# Patient Record
Sex: Female | Born: 1960 | Race: White | Hispanic: No | Marital: Married | Smoking: Never smoker
Health system: Southern US, Community
[De-identification: ages and names within clinical notes are randomized; demographics above are authoritative.]

## PROBLEM LIST (undated history)

## (undated) DIAGNOSIS — I10 Essential (primary) hypertension: Secondary | ICD-10-CM

## (undated) DIAGNOSIS — I700 Atherosclerosis of aorta: Secondary | ICD-10-CM

## (undated) DIAGNOSIS — E7800 Pure hypercholesterolemia, unspecified: Secondary | ICD-10-CM

## (undated) DIAGNOSIS — R931 Abnormal findings on diagnostic imaging of heart and coronary circulation: Secondary | ICD-10-CM

## (undated) DIAGNOSIS — E785 Hyperlipidemia, unspecified: Secondary | ICD-10-CM

## (undated) DIAGNOSIS — J302 Other seasonal allergic rhinitis: Secondary | ICD-10-CM

## (undated) DIAGNOSIS — R921 Mammographic calcification found on diagnostic imaging of breast: Secondary | ICD-10-CM

## (undated) DIAGNOSIS — Z98811 Dental restoration status: Secondary | ICD-10-CM

## (undated) DIAGNOSIS — R51 Headache: Secondary | ICD-10-CM

## (undated) DIAGNOSIS — IMO0002 Reserved for concepts with insufficient information to code with codable children: Secondary | ICD-10-CM

## (undated) DIAGNOSIS — Z972 Presence of dental prosthetic device (complete) (partial): Secondary | ICD-10-CM

## (undated) HISTORY — DX: Reserved for concepts with insufficient information to code with codable children: IMO0002

## (undated) HISTORY — DX: Atherosclerosis of aorta: I70.0

## (undated) HISTORY — DX: Pure hypercholesterolemia, unspecified: E78.00

## (undated) HISTORY — DX: Essential (primary) hypertension: I10

## (undated) HISTORY — DX: Abnormal findings on diagnostic imaging of heart and coronary circulation: R93.1

## (undated) HISTORY — DX: Hyperlipidemia, unspecified: E78.5

---

## 1997-08-17 ENCOUNTER — Other Ambulatory Visit: Admission: RE | Admit: 1997-08-17 | Discharge: 1997-08-17 | Payer: Self-pay | Admitting: Obstetrics and Gynecology

## 1999-08-07 ENCOUNTER — Other Ambulatory Visit: Admission: RE | Admit: 1999-08-07 | Discharge: 1999-08-07 | Payer: Self-pay | Admitting: Obstetrics and Gynecology

## 2000-03-06 ENCOUNTER — Other Ambulatory Visit: Admission: RE | Admit: 2000-03-06 | Discharge: 2000-03-06 | Payer: Self-pay | Admitting: Obstetrics and Gynecology

## 2000-03-06 ENCOUNTER — Encounter (INDEPENDENT_AMBULATORY_CARE_PROVIDER_SITE_OTHER): Payer: Self-pay | Admitting: Specialist

## 2000-05-04 ENCOUNTER — Encounter: Payer: Self-pay | Admitting: Cardiology

## 2000-05-04 ENCOUNTER — Inpatient Hospital Stay (HOSPITAL_COMMUNITY): Admission: EM | Admit: 2000-05-04 | Discharge: 2000-05-05 | Payer: Self-pay | Admitting: Emergency Medicine

## 2000-08-03 ENCOUNTER — Other Ambulatory Visit: Admission: RE | Admit: 2000-08-03 | Discharge: 2000-08-03 | Payer: Self-pay | Admitting: Obstetrics and Gynecology

## 2000-11-03 ENCOUNTER — Ambulatory Visit (HOSPITAL_COMMUNITY): Admission: RE | Admit: 2000-11-03 | Discharge: 2000-11-03 | Payer: Self-pay | Admitting: Obstetrics and Gynecology

## 2000-11-03 ENCOUNTER — Encounter: Payer: Self-pay | Admitting: Obstetrics and Gynecology

## 2001-01-15 ENCOUNTER — Encounter: Payer: Self-pay | Admitting: Family Medicine

## 2001-01-15 ENCOUNTER — Ambulatory Visit (HOSPITAL_COMMUNITY): Admission: RE | Admit: 2001-01-15 | Discharge: 2001-01-15 | Payer: Self-pay | Admitting: Family Medicine

## 2001-10-15 ENCOUNTER — Other Ambulatory Visit: Admission: RE | Admit: 2001-10-15 | Discharge: 2001-10-15 | Payer: Self-pay | Admitting: Obstetrics and Gynecology

## 2001-11-05 ENCOUNTER — Encounter: Admission: RE | Admit: 2001-11-05 | Discharge: 2001-11-05 | Payer: Self-pay | Admitting: Obstetrics and Gynecology

## 2001-11-05 ENCOUNTER — Encounter: Payer: Self-pay | Admitting: Obstetrics and Gynecology

## 2002-10-24 ENCOUNTER — Other Ambulatory Visit: Admission: RE | Admit: 2002-10-24 | Discharge: 2002-10-24 | Payer: Self-pay | Admitting: Obstetrics and Gynecology

## 2002-11-25 ENCOUNTER — Ambulatory Visit (HOSPITAL_COMMUNITY): Admission: RE | Admit: 2002-11-25 | Discharge: 2002-11-25 | Payer: Self-pay | Admitting: Obstetrics and Gynecology

## 2003-11-27 ENCOUNTER — Ambulatory Visit (HOSPITAL_COMMUNITY): Admission: RE | Admit: 2003-11-27 | Discharge: 2003-11-27 | Payer: Self-pay | Admitting: Obstetrics and Gynecology

## 2003-12-12 ENCOUNTER — Other Ambulatory Visit: Admission: RE | Admit: 2003-12-12 | Discharge: 2003-12-12 | Payer: Self-pay | Admitting: Obstetrics and Gynecology

## 2004-11-27 ENCOUNTER — Ambulatory Visit (HOSPITAL_COMMUNITY): Admission: RE | Admit: 2004-11-27 | Discharge: 2004-11-27 | Payer: Self-pay | Admitting: Obstetrics and Gynecology

## 2004-12-12 ENCOUNTER — Encounter: Admission: RE | Admit: 2004-12-12 | Discharge: 2004-12-12 | Payer: Self-pay | Admitting: Obstetrics and Gynecology

## 2005-09-10 ENCOUNTER — Other Ambulatory Visit: Admission: RE | Admit: 2005-09-10 | Discharge: 2005-09-10 | Payer: Self-pay | Admitting: Obstetrics & Gynecology

## 2005-12-01 ENCOUNTER — Ambulatory Visit (HOSPITAL_COMMUNITY): Admission: RE | Admit: 2005-12-01 | Discharge: 2005-12-01 | Payer: Self-pay | Admitting: Obstetrics & Gynecology

## 2005-12-10 ENCOUNTER — Encounter: Admission: RE | Admit: 2005-12-10 | Discharge: 2005-12-10 | Payer: Self-pay | Admitting: Obstetrics & Gynecology

## 2006-10-06 ENCOUNTER — Other Ambulatory Visit: Admission: RE | Admit: 2006-10-06 | Discharge: 2006-10-06 | Payer: Self-pay | Admitting: Obstetrics & Gynecology

## 2006-11-09 ENCOUNTER — Ambulatory Visit (HOSPITAL_BASED_OUTPATIENT_CLINIC_OR_DEPARTMENT_OTHER): Admission: RE | Admit: 2006-11-09 | Discharge: 2006-11-09 | Payer: Self-pay | Admitting: Obstetrics & Gynecology

## 2006-11-09 HISTORY — PX: HYSTEROSCOPY W/ ENDOMETRIAL ABLATION: SUR665

## 2006-12-03 ENCOUNTER — Ambulatory Visit (HOSPITAL_COMMUNITY): Admission: RE | Admit: 2006-12-03 | Discharge: 2006-12-03 | Payer: Self-pay | Admitting: Obstetrics & Gynecology

## 2007-01-01 ENCOUNTER — Encounter: Admission: RE | Admit: 2007-01-01 | Discharge: 2007-01-01 | Payer: Self-pay | Admitting: Obstetrics & Gynecology

## 2007-09-29 ENCOUNTER — Encounter: Admission: RE | Admit: 2007-09-29 | Discharge: 2007-09-29 | Payer: Self-pay | Admitting: Family

## 2007-12-07 ENCOUNTER — Ambulatory Visit (HOSPITAL_COMMUNITY): Admission: RE | Admit: 2007-12-07 | Discharge: 2007-12-08 | Payer: Self-pay | Admitting: Obstetrics & Gynecology

## 2007-12-30 ENCOUNTER — Other Ambulatory Visit: Admission: RE | Admit: 2007-12-30 | Discharge: 2007-12-30 | Payer: Self-pay | Admitting: Obstetrics & Gynecology

## 2009-03-28 ENCOUNTER — Ambulatory Visit (HOSPITAL_COMMUNITY)
Admission: RE | Admit: 2009-03-28 | Discharge: 2009-03-28 | Payer: Self-pay | Source: Home / Self Care | Admitting: Obstetrics & Gynecology

## 2010-02-17 ENCOUNTER — Encounter: Payer: Self-pay | Admitting: Obstetrics & Gynecology

## 2010-03-28 ENCOUNTER — Other Ambulatory Visit: Payer: Self-pay | Admitting: Obstetrics & Gynecology

## 2010-03-28 DIAGNOSIS — Z1231 Encounter for screening mammogram for malignant neoplasm of breast: Secondary | ICD-10-CM

## 2010-04-16 ENCOUNTER — Ambulatory Visit (HOSPITAL_COMMUNITY)
Admission: RE | Admit: 2010-04-16 | Discharge: 2010-04-16 | Disposition: A | Discharge status: Home / Self Care | Payer: BC Managed Care – PPO | Source: Ambulatory Visit | Attending: Obstetrics & Gynecology | Admitting: Obstetrics & Gynecology

## 2010-04-16 DIAGNOSIS — Z1231 Encounter for screening mammogram for malignant neoplasm of breast: Secondary | ICD-10-CM | POA: Insufficient documentation

## 2010-06-11 NOTE — Op Note (Signed)
NAMESHANIKA, LEVINGS            ACCOUNT NO.:  [id number]   MEDICAL RECORD NO.:  [id number]          PATIENT TYPE:  AMB   LOCATION:  [facility]                         FACILITY:  [facility]   PHYSICIAN:  M. Leda Quail, MD  DATE OF BIRTH:  07-03-1960   DATE OF PROCEDURE:  11/09/2006  DATE OF DISCHARGE:                               OPERATIVE REPORT   PREOPERATIVE DIAGNOSES:  39. A 50 year old G2, P61, married white female with menorrhagia.  2. Elevated lipids.   POSTOPERATIVE DIAGNOSES:  103. A 50 year old G2, P94, married white female with menorrhagia.  2. Elevated lipids.   PROCEDURE:  HTA with hysteroscopy and paracervical block.   SURGEON:  Dr. Hyacinth Meeker   ASSISTANT:  OR staff.  Dr. Council Mechanic was the anesthesiologist overseeing  the case.   ANESTHESIA:  MAC.   FINDINGS:  Fluff endometrial tissue but otherwise normal cavity without  any polyps or fibroids noted.  Normal tubal ostia are noted bilaterally.   SPECIMENS:  None.   ESTIMATED BLOOD LOSS:  Minimal.   FLUIDS:  1000 mL of LR.   URINE OUTPUT:  She had DDCs drained with an I&O catheterization at the  beginning of the procedure.   FLUID:  Deficit with 3% sorbitol is 0.   COMPLICATIONS:  None.   INDICATIONS:  Ms. Carnetta Losada is a very nice 50 year old G2, P83,  married white female who has a history of menorrhagia.  She has flow  that lasts about 1 week with clots that last for 3-4 days.  These are  particularly bad days with clots of dysmenorrhea associated.  This has  worsened over the last year.  She did undergo a workup including an  ultrasound with a sonohysterogram.  She had a 9.5 x 4.5 x 5 cm uterus  with a 7.9 mm endometrium.  She had otherwise normal-appearing ovaries.  She had an endometrial biopsy that showed benign proliferative  endometrium.  We discussed treatment options including oral  contraceptives, a Marina IUD, an endometrial ablation, and hysterectomy,  and she has opted to proceed with an ablation.   The patient is here for  this procedure today.   PROCEDURE:  The patient is taken to the operating room.  She is placed  in supine position.  MAC anesthesia is administered by the anesthesia  staff without difficulty.  The legs are then positioned in the low  lithotomy position in [place] stirrups.  Then the legs are positioned in  high lithotomy position, and she is prepped in a normal sterile fashion.  No Betadine solution was used, as THE PATIENT IS ALLERGIC TO IODINE.  An  I&O catheterization was performed without difficulty.  The patient is  draped in normal sterile fashion.   A bivalve speculum was placed in the vagina.  The anterior lip of the  cervix is grasped with single-tooth tenaculum.  The uterus is sounded to  10 cm.  The cervical canal is approximately 4.5 cm in length with the  endometrial cavity measuring about 5.5 cm.  Using [employer] dilators, a  Hagar dilator is inserted and dilated up to a #7.  A diagnostic  hysteroscope is obtained.  Then 3% sorbitol is used as a hysteroscopic  media.  This hysteroscope is passed through the endocervical canal with  good distention of the cavity.  Tubal ostia are noted bilaterally, and  photodocumentation is made.  The cavity otherwise appears normal except  for some fluffy endometrial tissue.  The scope is removed.  The NovaSure  apparatus is obtained.  It is raised opened outside of the patient and  showed that it is working properly.  This is passed to the fundus then  raised and opened.  The device is then seated by moving it up and down,  back and forth, and twisting it 90 degrees in each direction.  The array  did pop open, as indicated with the dial on the ray apparatus.  This was  approximately 3.5 cm.  The seating was performed again by moving the  device up and down, to the right and left, and twisting the device to  the right and left without any change in the width of the array.  Then  at this point, a cavity assessment test  was performed.  This was passed  easily.  Of note, the vacuum valve on the device had already been tapped  before placing this through the cervix.  At this point, the ablation  cycle was performed.  This lasted for 70 seconds with a power of 106.  After this procedure was performed, the device was unlocked, and the  array was closed, and the entire device was brought out of the uterus.  Then using a hysteroscope, the cavity was revisualized.  An excellent  ablation was noted.  This was removed.  Paracervical block of 1%  lidocaine was instilled in the cervix with 2.5 mL instilled at 3, 6, 9,  and 12 o'clock positions.  This was a total of 10 mL of lidocaine.  Then  30 mg of Toradol IV was given at this point.  The patient tolerated the  procedure very well.  She was awakened from her anesthesia without  difficulty after her legs were removed from the [place] stirrups and  placed back in the supine position.  Sponge, lap, needle, and instrument  counts were correct x2.  The patient was awake and taken to the recovery  room in stable condition.      Lum Keas, MD  Electronically Signed     MSM/MEDQ  D:  11/09/2006  T:  11/09/2006  Job:  [id number]

## 2010-06-14 NOTE — Discharge Summary (Signed)
[place]. [facility]  Patient:    Margaret Webb, Margaret Webb                      MRN: [id number] Adm. Date:  [id number] Disc. Date: [id number] Attending:  Talitha Givens Dictator:   Abelino Derrick, P.A.C. [facility] CC:         Deanna Artis. Sharene Skeans, M.D.                  Referring Physician Discharge [place]  DISCHARGE DIAGNOSES: 1. Chest pain, negative Cardiolite, this admission. 2. Treated hyperlipidemia. 3. Carpal tunnel syndrome.  HOSPITAL COURSE:  The patient is a 50 year old female with no cardiac history, who presented May 04, 2000 with chest pain.  She was seen by Dr. Luis Abed on admission.  She has a history of treated hyperlipidemia and is on Zocor 40 mg a day.  Patient was admitted to telemetry for further evaluation.  Her EKG showed sinus arrhythmia.  Enzymes were negative.  She was seen in consult by the neurology service; Dr. Deanna Artis. Hickling saw her for paresthesia with glove distribution.  Patient was set up for a Cardiolite study, which was done May 05, 2000, and revealed normal LV function and no ischemia.  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 Dr. Genene Churn. Love suspected carpal tunnel syndrome.  Plans were made to arrange for wrist splints and Celebrex.  She will continue her home Zocor. The patient did not want to wait for the final Cardiolite results and pretty much insisted on discharge later on the 9th.  LABORATORY DATA:  EKG showed a sinus rhythm without acute changes.  C spine films show degenerative hypertrophic spurs, no evidence of metastatic disease or fracture.  CKs and troponins are negative.  INR is 1.1.  WBC 8.9, hemoglobin 12.9, hematocrit 38.5 and platelets 309,000.  Sodium 141, potassium 3.9, BUN 11, creatinine 0.6.  B12 is 405.  Folate, RPR and protein are pending; these will need to be followed up as an outpatient.  Lipid profile shows an LDL of 136, HDL 46.  Sed rate 13.  TSH 5.05.  RPR is  nonreactive on final report.  B12 level 405.  RBC folate will be followed up.  DISPOSITION:  Patient is discharged in stable condition and will follow up with Dr. Hosie Spangle. [facility], [facility]. DD:  05/06/00 TD:  05/06/00 Job: [id number]
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
## 2010-11-07 LAB — POCT PREGNANCY, URINE
Operator id: 114531
Preg Test, Ur: NEGATIVE

## 2010-11-07 LAB — CBC
HCT: 36.6
Hemoglobin: 12.5
MCHC: 34.1
MCV: 88.4
Platelets: 287
RBC: 4.14
RDW: 13
WBC: 8.2

## 2010-11-07 LAB — BASIC METABOLIC PANEL
BUN: 12
CO2: 25
Calcium: 9.6
Chloride: 108
Creatinine, Ser: 0.8
GFR calc Af Amer: >60
GFR calc non Af Amer: >60
Glucose, Bld: 97
Potassium: 4.1
Sodium: 140

## 2010-11-07 LAB — URINALYSIS, ROUTINE W REFLEX MICROSCOPIC
Bilirubin Urine: NEGATIVE
Glucose, UA: NEGATIVE
Ketones, ur: NEGATIVE
Leukocytes, UA: NEGATIVE
Nitrite: NEGATIVE
Protein, ur: NEGATIVE
Specific Gravity, Urine: 1.017
Urobilinogen, UA: 0.2
pH: 5.5

## 2010-11-07 LAB — URINE MICROSCOPIC-ADD ON

## 2011-05-12 ENCOUNTER — Other Ambulatory Visit: Payer: Self-pay | Admitting: Obstetrics & Gynecology

## 2011-05-12 DIAGNOSIS — Z1231 Encounter for screening mammogram for malignant neoplasm of breast: Secondary | ICD-10-CM

## 2011-06-12 ENCOUNTER — Ambulatory Visit (HOSPITAL_COMMUNITY)
Admission: RE | Admit: 2011-06-12 | Discharge: 2011-06-12 | Disposition: A | Discharge status: Home / Self Care | Payer: BC Managed Care – PPO | Source: Ambulatory Visit | Attending: Obstetrics & Gynecology | Admitting: Obstetrics & Gynecology

## 2011-06-12 DIAGNOSIS — Z1231 Encounter for screening mammogram for malignant neoplasm of breast: Secondary | ICD-10-CM | POA: Insufficient documentation

## 2012-05-21 ENCOUNTER — Other Ambulatory Visit: Payer: Self-pay

## 2012-05-21 DIAGNOSIS — Z1231 Encounter for screening mammogram for malignant neoplasm of breast: Secondary | ICD-10-CM

## 2012-07-06 ENCOUNTER — Ambulatory Visit
Admission: RE | Admit: 2012-07-06 | Discharge: 2012-07-06 | Disposition: A | Discharge status: Home / Self Care | Payer: BC Managed Care – PPO | Source: Ambulatory Visit

## 2012-07-06 DIAGNOSIS — Z1231 Encounter for screening mammogram for malignant neoplasm of breast: Secondary | ICD-10-CM

## 2012-07-07 ENCOUNTER — Ambulatory Visit (HOSPITAL_COMMUNITY)
Admission: RE | Admit: 2012-07-07 | Discharge: 2012-07-07 | Disposition: A | Discharge status: Home / Self Care | Payer: BC Managed Care – PPO | Source: Ambulatory Visit | Attending: Family Medicine | Admitting: Family Medicine

## 2012-07-07 ENCOUNTER — Other Ambulatory Visit (HOSPITAL_COMMUNITY): Payer: Self-pay | Admitting: Family Medicine

## 2012-07-07 ENCOUNTER — Other Ambulatory Visit: Payer: Self-pay | Admitting: Obstetrics & Gynecology

## 2012-07-07 DIAGNOSIS — M79609 Pain in unspecified limb: Secondary | ICD-10-CM

## 2012-07-07 DIAGNOSIS — M25561 Pain in right knee: Secondary | ICD-10-CM

## 2012-07-07 DIAGNOSIS — R928 Other abnormal and inconclusive findings on diagnostic imaging of breast: Secondary | ICD-10-CM

## 2012-07-07 DIAGNOSIS — M25569 Pain in unspecified knee: Secondary | ICD-10-CM | POA: Insufficient documentation

## 2012-07-07 NOTE — Progress Notes (Signed)
Right lower extremity venous duplex completed.  Right:  No evidence of DVT, superficial thrombosis, or Baker's cyst.  Left:  Negative for DVT in the common femoral vein.  

## 2012-07-12 ENCOUNTER — Ambulatory Visit
Admission: RE | Admit: 2012-07-12 | Discharge: 2012-07-12 | Disposition: A | Discharge status: Home / Self Care | Payer: BC Managed Care – PPO | Source: Ambulatory Visit | Attending: Obstetrics & Gynecology | Admitting: Obstetrics & Gynecology

## 2012-07-12 ENCOUNTER — Other Ambulatory Visit: Payer: Self-pay | Admitting: Obstetrics & Gynecology

## 2012-07-12 DIAGNOSIS — R921 Mammographic calcification found on diagnostic imaging of breast: Secondary | ICD-10-CM

## 2012-07-12 DIAGNOSIS — R928 Other abnormal and inconclusive findings on diagnostic imaging of breast: Secondary | ICD-10-CM

## 2012-07-19 ENCOUNTER — Ambulatory Visit
Admission: RE | Admit: 2012-07-19 | Discharge: 2012-07-19 | Disposition: A | Discharge status: Home / Self Care | Payer: BC Managed Care – PPO | Source: Ambulatory Visit | Attending: Obstetrics & Gynecology | Admitting: Obstetrics & Gynecology

## 2012-07-19 ENCOUNTER — Other Ambulatory Visit (HOSPITAL_COMMUNITY): Payer: Self-pay | Admitting: Radiology

## 2012-07-19 DIAGNOSIS — R921 Mammographic calcification found on diagnostic imaging of breast: Secondary | ICD-10-CM

## 2012-07-27 DIAGNOSIS — R921 Mammographic calcification found on diagnostic imaging of breast: Secondary | ICD-10-CM

## 2012-07-27 HISTORY — DX: Mammographic calcification found on diagnostic imaging of breast: R92.1

## 2012-08-02 ENCOUNTER — Encounter (INDEPENDENT_AMBULATORY_CARE_PROVIDER_SITE_OTHER): Payer: Self-pay | Admitting: Surgery

## 2012-08-02 ENCOUNTER — Ambulatory Visit (INDEPENDENT_AMBULATORY_CARE_PROVIDER_SITE_OTHER): Payer: BC Managed Care – PPO | Admitting: Surgery

## 2012-08-02 VITALS — BP 136/90 | HR 76 | Temp 97.8°F | Resp 14 | Ht 65.0 in | Wt 192.8 lb

## 2012-08-02 DIAGNOSIS — R928 Other abnormal and inconclusive findings on diagnostic imaging of breast: Secondary | ICD-10-CM

## 2012-08-02 DIAGNOSIS — R921 Mammographic calcification found on diagnostic imaging of breast: Secondary | ICD-10-CM

## 2012-08-02 NOTE — Patient Instructions (Signed)
We will schedule outpatient surgery to remove a small area of calcifications from your right breast

## 2012-08-02 NOTE — Progress Notes (Signed)
NAMENILZA Webb DOB: February 04, 1960 MRN: [id number]                                                                                      DATE: 08/02/2012  PCP: Margaret Boots, MD Referring Provider: No ref. provider found  IMPRESSION:  Complex sclerosing lesion with associated calcifications, right breast  PLAN:   I recommended wire localized excision. I have discussed the indications for the lumpectomy and described the procedure. She understand that the chance of removal of the abnormal area is very good, but that occasionally we are unable to locate it and may have to do a second procedure. We also discussed the possibility of a second procedure to get additional tissue. Risks of surgery such as bleeding and infection have also been explained, as well as the implications of not doing the surgery. She understands and wishes to proceed.                  CC:  Chief Complaint  Patient presents with  . Breast Problem    Right breast    HPI:  Margaret Webb is a 52 y.o.  female who presents for evaluation of A complex sclerosing lesion of the right breast found on recent biopsy. She had a routine mammogram done and some calcifications that were somewhat worrisome or found. A core biopsy was done and fibrocystic changes and a small complex sclerosing lesion with calcifications were noted. 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 She has no breast symptoms and has had no prior breast problems. She has a sister who was diagnosed with premenopausal breast cancer about 10 years ago and now has metastatic disease to bones. The sister was tested for genetic abnormalities about 2 years ago and found to be negative. It is uncertain exactly what tests were done however.  PMH:  has no past medical history on file.  PSH:   has past surgical history that includes Cesarean section.  ALLERGIES:  No Known Allergies  MEDICATIONS: Current outpatient  prescriptions:carisoprodol (SOMA) 350 MG tablet, Take 350 mg by mouth 4 (four) times daily as needed for muscle spasms., Disp: , Rfl: ;  atorvastatin (LIPITOR) 40 MG tablet, , Disp: , Rfl: ;  zolpidem (AMBIEN) 10 MG tablet, , Disp: , Rfl:   ROS: She has filled out our 12 point review of systems and it is negative . EXAM:   VITAL SIGNS:  BP 136/90  Pulse 76  Temp(Src) 97.8 F (36.6 C) (Temporal)  Resp 14  Ht 5\' 5"  (1.651 m)  Wt 192 lb 12.8 oz (87.454 kg)  BMI 32.08 kg/m2  GENERAL:  The patient is alert, oriented, and generally healthy-appearing, NAD. Mood and affect are normal.  HEENT:  The head is normocephalic, the eyes nonicteric, the pupils were round regular and equal. EOMs are normal. Pharynx normal. Dentition good.  NECK:  The neck is supple and there are no masses or thyromegaly.  LUNGS: Normal respirations and clear to auscultation.  HEART: Regular rhythm, with no murmurs rubs or gallops. Pulses are intact carotid dorsalis pedis and posterior tibial. No significant varicosities are noted.  BREASTS:  breasts symmetric in size and shape. There is a mild ecchymosis of the right breast lower outer quadrant. The breasts are little bit dense consistent with some fibrocystic changes. I do not appreciate a dominant mass. The skin nipple and areolar areas are normal.  Lymphatics: There is no action or supraclavicular adenopathy.  ABDOMEN: Soft, flat, and nontender. No masses or organomegaly is noted. No hernias are noted. Bowel sounds are normal.  EXTREMITIES:  Good range of motion, no edema.   DATA REVIEWED:  I have reviewed the mammograms ultrasound and pathology reports    Margaret Webb J 08/02/2012  CC: No ref. provider found, Margaret Webb, Margaret Stalker, MD

## 2012-08-03 ENCOUNTER — Encounter (HOSPITAL_BASED_OUTPATIENT_CLINIC_OR_DEPARTMENT_OTHER): Payer: Self-pay | Admitting: *Deleted

## 2012-08-09 ENCOUNTER — Ambulatory Visit (HOSPITAL_BASED_OUTPATIENT_CLINIC_OR_DEPARTMENT_OTHER): Payer: BC Managed Care – PPO | Admitting: Certified Registered Nurse Anesthetist

## 2012-08-09 ENCOUNTER — Encounter (HOSPITAL_BASED_OUTPATIENT_CLINIC_OR_DEPARTMENT_OTHER): Payer: Self-pay | Admitting: Certified Registered Nurse Anesthetist

## 2012-08-09 ENCOUNTER — Encounter (HOSPITAL_BASED_OUTPATIENT_CLINIC_OR_DEPARTMENT_OTHER)
Admission: RE | Disposition: A | Discharge status: Home / Self Care | Payer: Self-pay | Source: Ambulatory Visit | Attending: Surgery

## 2012-08-09 ENCOUNTER — Ambulatory Visit
Admission: RE | Admit: 2012-08-09 | Discharge: 2012-08-09 | Disposition: A | Discharge status: Home / Self Care | Payer: BC Managed Care – PPO | Source: Ambulatory Visit | Attending: Surgery | Admitting: Surgery

## 2012-08-09 ENCOUNTER — Ambulatory Visit (HOSPITAL_BASED_OUTPATIENT_CLINIC_OR_DEPARTMENT_OTHER)
Admission: RE | Admit: 2012-08-09 | Discharge: 2012-08-09 | Disposition: A | Discharge status: Home / Self Care | Payer: BC Managed Care – PPO | Source: Ambulatory Visit | Attending: Surgery | Admitting: Surgery

## 2012-08-09 DIAGNOSIS — Z803 Family history of malignant neoplasm of breast: Secondary | ICD-10-CM | POA: Insufficient documentation

## 2012-08-09 DIAGNOSIS — R921 Mammographic calcification found on diagnostic imaging of breast: Secondary | ICD-10-CM

## 2012-08-09 DIAGNOSIS — N6029 Fibroadenosis of unspecified breast: Secondary | ICD-10-CM | POA: Insufficient documentation

## 2012-08-09 DIAGNOSIS — N6019 Diffuse cystic mastopathy of unspecified breast: Secondary | ICD-10-CM | POA: Insufficient documentation

## 2012-08-09 DIAGNOSIS — D249 Benign neoplasm of unspecified breast: Secondary | ICD-10-CM | POA: Insufficient documentation

## 2012-08-09 DIAGNOSIS — Z79899 Other long term (current) drug therapy: Secondary | ICD-10-CM | POA: Insufficient documentation

## 2012-08-09 HISTORY — DX: Mammographic calcification found on diagnostic imaging of breast: R92.1

## 2012-08-09 HISTORY — DX: Presence of dental prosthetic device (complete) (partial): Z97.2

## 2012-08-09 HISTORY — DX: Headache: R51

## 2012-08-09 HISTORY — PX: BREAST BIOPSY: SHX20

## 2012-08-09 HISTORY — DX: Dental restoration status: Z98.811

## 2012-08-09 HISTORY — DX: Other seasonal allergic rhinitis: J30.2

## 2012-08-09 SURGERY — BREAST BIOPSY WITH NEEDLE LOCALIZATION
Anesthesia: General | Site: Breast | Laterality: Right | Wound class: Clean

## 2012-08-09 MED ORDER — MIDAZOLAM HCL 5 MG/5ML IJ SOLN
INTRAMUSCULAR | Status: DC | PRN
  Administered 2012-08-09: 2 mg via INTRAVENOUS

## 2012-08-09 MED ORDER — PROMETHAZINE HCL 25 MG/ML IJ SOLN
6.2500 mg | INTRAMUSCULAR | Status: DC | PRN
  Administered 2012-08-09: 6.25 mg via INTRAVENOUS

## 2012-08-09 MED ORDER — FENTANYL CITRATE 0.05 MG/ML IJ SOLN
INTRAMUSCULAR | Status: DC | PRN
  Administered 2012-08-09 (×2): 50 ug via INTRAVENOUS

## 2012-08-09 MED ORDER — FENTANYL CITRATE 0.05 MG/ML IJ SOLN
25.0000 ug | INTRAMUSCULAR | Status: DC | PRN
  Administered 2012-08-09: 50 ug via INTRAVENOUS

## 2012-08-09 MED ORDER — CHLORHEXIDINE GLUCONATE 4 % EX LIQD: 1.0000 "application " | Freq: Once | CUTANEOUS | Status: DC

## 2012-08-09 MED ORDER — ONDANSETRON HCL 4 MG/2ML IJ SOLN
INTRAMUSCULAR | Status: DC | PRN
  Administered 2012-08-09: 4 mg via INTRAVENOUS

## 2012-08-09 MED ORDER — OXYCODONE HCL 5 MG PO TABS: 5.0000 mg | ORAL_TABLET | Freq: Once | ORAL | Status: DC | PRN

## 2012-08-09 MED ORDER — ONDANSETRON HCL 4 MG/2ML IJ SOLN: 4.0000 mg | Freq: Four times a day (QID) | INTRAMUSCULAR | Status: DC | PRN

## 2012-08-09 MED ORDER — FENTANYL CITRATE 0.05 MG/ML IJ SOLN: 50.0000 ug | INTRAMUSCULAR | Status: DC | PRN

## 2012-08-09 MED ORDER — LACTATED RINGERS IV SOLN
INTRAVENOUS | Status: DC
  Administered 2012-08-09 (×2): via INTRAVENOUS

## 2012-08-09 MED ORDER — PROPOFOL 10 MG/ML IV BOLUS
INTRAVENOUS | Status: DC | PRN
  Administered 2012-08-09: 200 mg via INTRAVENOUS

## 2012-08-09 MED ORDER — CEFAZOLIN SODIUM-DEXTROSE 2-3 GM-% IV SOLR
2.0000 g | INTRAVENOUS | Status: AC
  Administered 2012-08-09: 2 g via INTRAVENOUS

## 2012-08-09 MED ORDER — BUPIVACAINE HCL (PF) 0.25 % IJ SOLN
INTRAMUSCULAR | Status: DC | PRN
  Administered 2012-08-09: 20 mL

## 2012-08-09 MED ORDER — HYDROCODONE-ACETAMINOPHEN 5-325 MG PO TABS: 1.0000 | ORAL_TABLET | ORAL | Status: DC | PRN

## 2012-08-09 MED ORDER — OXYCODONE HCL 5 MG/5ML PO SOLN: 5.0000 mg | Freq: Once | ORAL | Status: DC | PRN

## 2012-08-09 MED ORDER — DEXAMETHASONE SODIUM PHOSPHATE 4 MG/ML IJ SOLN
INTRAMUSCULAR | Status: DC | PRN
  Administered 2012-08-09: 10 mg via INTRAVENOUS

## 2012-08-09 MED ORDER — MIDAZOLAM HCL 2 MG/2ML IJ SOLN: 1.0000 mg | INTRAMUSCULAR | Status: DC | PRN

## 2012-08-09 MED ORDER — LIDOCAINE HCL (CARDIAC) 20 MG/ML IV SOLN
INTRAVENOUS | Status: DC | PRN
  Administered 2012-08-09: 60 mg via INTRAVENOUS

## 2012-08-09 SURGICAL SUPPLY — 51 items
ADH SKN CLS APL DERMABOND .7 (GAUZE/BANDAGES/DRESSINGS) ×1
APPLICATOR COTTON TIP 6IN STRL (MISCELLANEOUS) IMPLANT
BINDER BREAST LRG (GAUZE/BANDAGES/DRESSINGS) IMPLANT
BINDER BREAST MEDIUM (GAUZE/BANDAGES/DRESSINGS) IMPLANT
BINDER BREAST XLRG (GAUZE/BANDAGES/DRESSINGS) IMPLANT
BINDER BREAST XXLRG (GAUZE/BANDAGES/DRESSINGS) IMPLANT
BLADE HEX COATED 2.75 (ELECTRODE) ×2 IMPLANT
BLADE SURG 15 STRL LF DISP TIS (BLADE) ×1 IMPLANT
BLADE SURG 15 STRL SS (BLADE) ×2
CANISTER SUCTION 1200CC (MISCELLANEOUS) ×2 IMPLANT
CHLORAPREP W/TINT 26ML (MISCELLANEOUS) ×2 IMPLANT
CLIP TI MEDIUM 6 (CLIP) IMPLANT
CLIP TI WIDE RED SMALL 6 (CLIP) IMPLANT
CLOTH BEACON ORANGE TIMEOUT ST (SAFETY) ×2 IMPLANT
COVER MAYO STAND STRL (DRAPES) ×2 IMPLANT
COVER TABLE BACK 60X90 (DRAPES) ×2 IMPLANT
DECANTER SPIKE VIAL GLASS SM (MISCELLANEOUS) IMPLANT
DERMABOND ADVANCED (GAUZE/BANDAGES/DRESSINGS) ×1
DERMABOND ADVANCED .7 DNX12 (GAUZE/BANDAGES/DRESSINGS) ×1 IMPLANT
DEVICE DUBIN W/COMP PLATE 8390 (MISCELLANEOUS) ×2 IMPLANT
DRAPE LAPAROTOMY TRNSV 102X78 (DRAPE) ×2 IMPLANT
DRAPE SURG 17X23 STRL (DRAPES) IMPLANT
DRAPE UTILITY XL STRL (DRAPES) ×2 IMPLANT
ELECT REM PT RETURN 9FT ADLT (ELECTROSURGICAL) ×2
ELECTRODE REM PT RTRN 9FT ADLT (ELECTROSURGICAL) ×1 IMPLANT
GLOVE BIO SURGEON STRL SZ 6.5 (GLOVE) ×1 IMPLANT
GLOVE BIOGEL PI IND STRL 7.0 (GLOVE) IMPLANT
GLOVE BIOGEL PI INDICATOR 7.0 (GLOVE) ×1
GLOVE ECLIPSE 6.5 STRL STRAW (GLOVE) ×1 IMPLANT
GLOVE EUDERMIC 7 POWDERFREE (GLOVE) ×2 IMPLANT
GOWN PREVENTION PLUS XLARGE (GOWN DISPOSABLE) ×4 IMPLANT
GOWN PREVENTION PLUS XXLARGE (GOWN DISPOSABLE) ×1 IMPLANT
KIT MARKER MARGIN INK (KITS) IMPLANT
NDL HYPO 25X1 1.5 SAFETY (NEEDLE) ×1 IMPLANT
NEEDLE HYPO 25X1 1.5 SAFETY (NEEDLE) ×2 IMPLANT
NS IRRIG 1000ML POUR BTL (IV SOLUTION) ×2 IMPLANT
PACK BASIN DAY SURGERY FS (CUSTOM PROCEDURE TRAY) ×2 IMPLANT
PENCIL BUTTON HOLSTER BLD 10FT (ELECTRODE) ×2 IMPLANT
SHEET MEDIUM DRAPE 40X70 STRL (DRAPES) IMPLANT
SLEEVE SCD COMPRESS KNEE MED (MISCELLANEOUS) ×2 IMPLANT
SPONGE GAUZE 4X4 12PLY (GAUZE/BANDAGES/DRESSINGS) IMPLANT
SPONGE INTESTINAL PEANUT (DISPOSABLE) IMPLANT
SPONGE LAP 4X18 X RAY DECT (DISPOSABLE) ×2 IMPLANT
STAPLER VISISTAT 35W (STAPLE) IMPLANT
SUT MNCRL AB 4-0 PS2 18 (SUTURE) ×2 IMPLANT
SUT SILK 0 TIES 10X30 (SUTURE) IMPLANT
SUT VICRYL 3-0 CR8 SH (SUTURE) ×2 IMPLANT
SYR CONTROL 10ML LL (SYRINGE) ×2 IMPLANT
TOWEL OR NON WOVEN STRL DISP B (DISPOSABLE) ×2 IMPLANT
TUBE CONNECTING 20X1/4 (TUBING) ×2 IMPLANT
YANKAUER SUCT BULB TIP NO VENT (SUCTIONS) ×2 IMPLANT

## 2012-08-09 NOTE — Transfer of Care (Signed)
Immediate Anesthesia Transfer of Care Note  Patient: Margaret Webb  Procedure(s) Performed: Procedure(s) with comments: BREAST BIOPSY WITH NEEDLE LOCALIZATION (Right) - needle localization at breast center of [place] 10   Patient Location: PACU  Anesthesia Type:General  Level of Consciousness: awake, alert , oriented and patient cooperative  Airway & Oxygen Therapy: Patient Spontanous Breathing and Patient connected to face mask oxygen  Post-op Assessment: Report given to PACU RN and Post -op Vital signs reviewed and stable  Post vital signs: Reviewed and stable  Complications: No apparent anesthesia complications

## 2012-08-09 NOTE — Op Note (Signed)
Tineshia D'AMBROSIO 03-11-1960 [id number] 08/02/2012  Preoperative diagnosis: right breast calcifications with complex sclerosing lesion on needle core biopsy  Postoperative diagnosis: same  Procedure: wire localized excision of right breast calcifications  Surgeon: Currie Paris, MD,   Anesthesia: General   Clinical History and Indications: this patient had a mammogram recently which showed an abnormality in the approximately 6:00 position of the right breast. A needle core biopsy was done showing fibroadenoma but also a complex sclerosing lesion. Excisional biopsy was recommended and the patient agreed to proceed.    Description of Procedure: I saw the patient in preoperative area, confirm the plans with her, marked right breast as the operative site, and reviewed the wire localizing films. The guidewire entered in the lower outer quadrant did seem to track from lateral to medial as the patient was supine.  The patient was taken to the operating room. After satisfactory general anesthesia was obtained the right breast was prepped and draped in a time out performed. I made a curvilinear incision directly over the path of the guidewire. Guidewire was manipulated into the wound. Using cautery I took a cylinder of tissue about 1.5 cm in diameter around the guidewire going all the way beyond the tip. Specimen mammogram showed the appropriate area was removed.  I then irrigated make sure everything was dry. 20 cc of 0.25% Marcaine was infiltrated to help with postop pain relief. A final check was made for hemostasis everything appeared to be dry. Incision was closed with interrupted 3-0 Vicryl followed by 4-0 Monocryl subcuticular and Dermabond on the skin.  The patient tolerated the procedure well there were no complications and counts were correct. Blood loss was minimal.  Currie Paris, MD, FACS 08/09/2012 3:29 PM

## 2012-08-09 NOTE — Anesthesia Preprocedure Evaluation (Addendum)
Anesthesia Evaluation  Patient identified by MRN, date of birth, ID band Patient awake    Reviewed: Allergy & Precautions, H&P , NPO status , Patient's Chart, lab work & pertinent test results  Airway Mallampati: II  Neck ROM: full    Dental   Pulmonary          Cardiovascular     Neuro/Psych  Headaches,    GI/Hepatic   Endo/Other  obese  Renal/GU      Musculoskeletal   Abdominal   Peds  Hematology   Anesthesia Other Findings   Reproductive/Obstetrics                          Anesthesia Physical Anesthesia Plan  ASA: II  Anesthesia Plan: General   Post-op Pain Management:    Induction: Intravenous  Airway Management Planned: LMA  Additional Equipment:   Intra-op Plan:   Post-operative Plan:   Informed Consent: I have reviewed the patients History and Physical, chart, labs and discussed the procedure including the risks, benefits and alternatives for the proposed anesthesia with the patient or authorized representative who has indicated his/her understanding and acceptance.     Plan Discussed with: CRNA, Anesthesiologist and Surgeon  Anesthesia Plan Comments:         Anesthesia Quick Evaluation

## 2012-08-09 NOTE — Anesthesia Postprocedure Evaluation (Signed)
Anesthesia Post Note  Patient: Margaret Webb  Procedure(s) Performed: Procedure(s) (LRB): BREAST BIOPSY WITH NEEDLE LOCALIZATION (Right)  Anesthesia type: General  Patient location: PACU  Post pain: Pain level controlled and Adequate analgesia  Post assessment: Post-op Vital signs reviewed, Patient's Cardiovascular Status Stable, Respiratory Function Stable, Patent Airway and Pain level controlled  Last Vitals:  Filed Vitals:   08/09/12 1345  BP: 128/80  Pulse: 53  Temp:   Resp: 13    Post vital signs: Reviewed and stable  Level of consciousness: awake, alert  and oriented  Complications: No apparent anesthesia complications

## 2012-08-09 NOTE — Anesthesia Procedure Notes (Signed)
Procedure Name: LMA Insertion Date/Time: 08/09/2012 12:06 PM Performed by: Mindel Friscia D Pre-anesthesia Checklist: Patient identified, Emergency Drugs available, Suction available and Patient being monitored Patient Re-evaluated:Patient Re-evaluated prior to inductionOxygen Delivery Method: Circle System Utilized Preoxygenation: Pre-oxygenation with 100% oxygen Intubation Type: IV induction Ventilation: Mask ventilation without difficulty LMA: LMA inserted LMA Size: 4.0 Number of attempts: 1 Airway Equipment and Method: bite block Placement Confirmation: positive ETCO2 Tube secured with: Tape Dental Injury: Teeth and Oropharynx as per pre-operative assessment

## 2012-08-09 NOTE — H&P (View-Only) (Signed)
NAME: Margaret Webb DOB: 01/19/1961 MRN: [id number]                                                                                      DATE: 08/02/2012  PCP: MILLER, MARY SUZANNE, MD Referring Provider: No ref. provider found  IMPRESSION:  Complex sclerosing lesion with associated calcifications, right breast  PLAN:   I recommended wire localized excision. I have discussed the indications for the lumpectomy and described the procedure. She understand that the chance of removal of the abnormal area is very good, but that occasionally we are unable to locate it and may have to do a second procedure. We also discussed the possibility of a second procedure to get additional tissue. Risks of surgery such as bleeding and infection have also been explained, as well as the implications of not doing the surgery. She understands and wishes to proceed.                  CC:  Chief Complaint  Patient presents with  . Breast Problem    Right breast    HPI:  Margaret Webb is a 52 y.o.  female who presents for evaluation of A complex sclerosing lesion of the right breast found on recent biopsy. She had a routine mammogram done and some calcifications that were somewhat worrisome or found. A core biopsy was done and fibrocystic changes and a small complex sclerosing lesion with calcifications were noted. 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 She has no breast symptoms and has had no prior breast problems. She has a sister who was diagnosed with premenopausal breast cancer about 10 years ago and now has metastatic disease to bones. The sister was tested for genetic abnormalities about 2 years ago and found to be negative. It is uncertain exactly what tests were done however.  PMH:  has no past medical history on file.  PSH:   has past surgical history that includes Cesarean section.  ALLERGIES:  No Known Allergies  MEDICATIONS: Current outpatient  prescriptions:carisoprodol (SOMA) 350 MG tablet, Take 350 mg by mouth 4 (four) times daily as needed for muscle spasms., Disp: , Rfl: ;  atorvastatin (LIPITOR) 40 MG tablet, , Disp: , Rfl: ;  zolpidem (AMBIEN) 10 MG tablet, , Disp: , Rfl:   ROS: She has filled out our 12 point review of systems and it is negative . EXAM:   VITAL SIGNS:  BP 136/90  Pulse 76  Temp(Src) 97.8 F (36.6 C) (Temporal)  Resp 14  Ht 5' 5" (1.651 m)  Wt 192 lb 12.8 oz (87.454 kg)  BMI 32.08 kg/m2  GENERAL:  The patient is alert, oriented, and generally healthy-appearing, NAD. Mood and affect are normal.  HEENT:  The head is normocephalic, the eyes nonicteric, the pupils were round regular and equal. EOMs are normal. Pharynx normal. Dentition good.  NECK:  The neck is supple and there are no masses or thyromegaly.  LUNGS: Normal respirations and clear to auscultation.  HEART: Regular rhythm, with no murmurs rubs or gallops. Pulses are intact carotid dorsalis pedis and posterior tibial. No significant varicosities are noted.  BREASTS:    breasts symmetric in size and shape. There is a mild ecchymosis of the right breast lower outer quadrant. The breasts are little bit dense consistent with some fibrocystic changes. I do not appreciate a dominant mass. The skin nipple and areolar areas are normal.  Lymphatics: There is no action or supraclavicular adenopathy.  ABDOMEN: Soft, flat, and nontender. No masses or organomegaly is noted. No hernias are noted. Bowel sounds are normal.  EXTREMITIES:  Good range of motion, no edema.   DATA REVIEWED:  I have reviewed the mammograms ultrasound and pathology reports    Margaret Webb 08/02/2012  CC: No ref. provider found, MILLER, MARY SUZANNE, MD        

## 2012-08-09 NOTE — Interval H&P Note (Signed)
History and Physical Interval Note:  08/09/2012 11:59 AM  Margaret Webb  has presented today for surgery, with the diagnosis of right breast calcifcations   The various methods of treatment have been discussed with the patient and family. After consideration of risks, benefits and other options for treatment, the patient has consented to  Procedure(s) with comments: BREAST BIOPSY WITH NEEDLE LOCALIZATION (Right) - needle localization at breast center of [place] [facility]  as a surgical intervention .  The patient's history has been reviewed, patient examined, no change in status, stable for surgery.  I have reviewed the patient's chart and labs.  Questions were answered to the patient's satisfaction.   I reviewed the wire localization films and marked the right breast as the operative side  [name]

## 2012-08-10 ENCOUNTER — Encounter (HOSPITAL_BASED_OUTPATIENT_CLINIC_OR_DEPARTMENT_OTHER): Payer: Self-pay | Admitting: Surgery

## 2012-08-12 ENCOUNTER — Telehealth (INDEPENDENT_AMBULATORY_CARE_PROVIDER_SITE_OTHER): Payer: Self-pay | Admitting: *Deleted

## 2012-08-12 NOTE — Telephone Encounter (Signed)
Patient called in regarding surg path results and was given Dr. Tenna Child message on the report "benign and as expected".  Patient states understanding.

## 2012-08-18 ENCOUNTER — Telehealth: Payer: Self-pay | Admitting: Obstetrics & Gynecology

## 2012-08-18 NOTE — Telephone Encounter (Signed)
Margaret Webb, Pt is calling to speak with you regarding an appointment she had with dr Hyacinth Meeker in December that you cancelled and rescheduled to October 21,2014. She does not know why it was cancelled and would like to speak with you only.

## 2012-08-20 NOTE — Telephone Encounter (Signed)
All of patients questions have been answered and new appointment has been made.

## 2012-08-30 ENCOUNTER — Encounter (INDEPENDENT_AMBULATORY_CARE_PROVIDER_SITE_OTHER): Payer: BC Managed Care – PPO | Admitting: Surgery

## 2012-09-01 ENCOUNTER — Telehealth (INDEPENDENT_AMBULATORY_CARE_PROVIDER_SITE_OTHER): Payer: Self-pay

## 2012-09-01 NOTE — Telephone Encounter (Signed)
Late entry:  Patient called in to reschedule her appointment because she will be out of town.  She wanted to know what she can do and not do.  I told her this far out she can do her regular activities.  I asked her to wait another week to swim or soak in a tub but she has already been in her own pool.  She is traveling to [place].  I rescheduled her appointment to 8/20.

## 2012-09-03 ENCOUNTER — Encounter (INDEPENDENT_AMBULATORY_CARE_PROVIDER_SITE_OTHER): Payer: BC Managed Care – PPO | Admitting: Surgery

## 2012-09-15 ENCOUNTER — Encounter (INDEPENDENT_AMBULATORY_CARE_PROVIDER_SITE_OTHER): Payer: Self-pay | Admitting: Surgery

## 2012-09-15 ENCOUNTER — Ambulatory Visit (INDEPENDENT_AMBULATORY_CARE_PROVIDER_SITE_OTHER): Payer: BC Managed Care – PPO | Admitting: Surgery

## 2012-09-15 VITALS — BP 130/84 | HR 76 | Resp 16 | Ht 65.0 in

## 2012-09-15 DIAGNOSIS — R921 Mammographic calcification found on diagnostic imaging of breast: Secondary | ICD-10-CM

## 2012-09-15 DIAGNOSIS — R928 Other abnormal and inconclusive findings on diagnostic imaging of breast: Secondary | ICD-10-CM

## 2012-09-15 NOTE — Patient Instructions (Signed)
Continue annual mammograms We will see you again on an as needed basis. Please call the office at [phone] if you have any questions or concerns. Thank you for allowing us to take care of you.  

## 2012-09-15 NOTE — Progress Notes (Signed)
NAME: Margaret Webb                                            DOB: 09/05/1960 DATE: 09/15/2012                                                  MRN: [id number]  CC:  Chief Complaint  Patient presents with  . Routine Post Op    HPI: This patient comes in for post op follow-up .Sheunderwent wire localized excision of left breast calcifications on 08/09/2012. She feels that she is doing well.  PE:  VITAL SIGNS: BP 130/84  Pulse 76  Resp 16  Ht 5\' 5"  (1.651 m)  General: The patient appears to be healthy, NAD Incision healing nicely  DATA REVIEWED: Diagnosis Breast, lumpectomy, Right - FIBROCYSTIC CHANGES WITH CALCIFICATIONS. - HEALING BIOPSY SITE. - THERE IS NO EVIDENCE OF MALIGNANCY. - SEE COMMENT. Microscopic Comment The surgical resection margin(s) of the specimen were inked and microscopically evaluated. Pecola Leisure MD Pathologist, Electronic Signature (Case signed 08/11/2012)  IMPRESSION: The patient is doing well S/P NL excisional breast biopsy.    PLAN: RTC PRN. Gave her a copy of the pathand reviewed with her

## 2012-11-03 ENCOUNTER — Other Ambulatory Visit: Payer: Self-pay | Admitting: Family Medicine

## 2012-11-03 DIAGNOSIS — M542 Cervicalgia: Secondary | ICD-10-CM

## 2012-11-05 ENCOUNTER — Ambulatory Visit
Admission: RE | Admit: 2012-11-05 | Discharge: 2012-11-05 | Disposition: A | Discharge status: Home / Self Care | Payer: BC Managed Care – PPO | Source: Ambulatory Visit | Attending: Family Medicine | Admitting: Family Medicine

## 2012-11-05 DIAGNOSIS — M542 Cervicalgia: Secondary | ICD-10-CM

## 2012-11-16 ENCOUNTER — Ambulatory Visit: Payer: Self-pay | Admitting: Obstetrics & Gynecology

## 2012-11-18 ENCOUNTER — Encounter: Payer: Self-pay | Admitting: Obstetrics & Gynecology

## 2012-11-19 ENCOUNTER — Encounter: Payer: Self-pay | Admitting: Obstetrics & Gynecology

## 2012-11-19 ENCOUNTER — Ambulatory Visit (INDEPENDENT_AMBULATORY_CARE_PROVIDER_SITE_OTHER): Payer: BC Managed Care – PPO | Admitting: Obstetrics & Gynecology

## 2012-11-19 VITALS — BP 120/80 | HR 76 | Resp 18 | Ht 64.25 in | Wt 190.0 lb

## 2012-11-19 DIAGNOSIS — Z01419 Encounter for gynecological examination (general) (routine) without abnormal findings: Secondary | ICD-10-CM

## 2012-11-19 DIAGNOSIS — Z124 Encounter for screening for malignant neoplasm of cervix: Secondary | ICD-10-CM

## 2012-11-19 DIAGNOSIS — Z0000 Encounter for general adult medical examination without abnormal findings: Secondary | ICD-10-CM

## 2012-11-19 LAB — COMPREHENSIVE METABOLIC PANEL
ALT: 19 U/L (ref 0–35)
AST: 20 U/L (ref 0–37)
Albumin: 4 g/dL (ref 3.5–5.2)
BUN: 14 mg/dL (ref 6–23)
Calcium: 9.3 mg/dL (ref 8.4–10.5)
Chloride: 105 mEq/L (ref 96–112)
Potassium: 4.4 mEq/L (ref 3.5–5.3)
Sodium: 143 mEq/L (ref 135–145)
Total Protein: 6.8 g/dL (ref 6.0–8.3)

## 2012-11-19 LAB — POCT URINALYSIS DIPSTICK
Bilirubin, UA: NEGATIVE
Ketones, UA: NEGATIVE
Leukocytes, UA: NEGATIVE
Protein, UA: NEGATIVE

## 2012-11-19 LAB — HEMOGLOBIN, FINGERSTICK: Hemoglobin, fingerstick: 12.8 g/dL (ref 12.0–16.0)

## 2012-11-19 LAB — LIPID PANEL
LDL Cholesterol: 115 mg/dL — ABNORMAL HIGH (ref 0–99)
Triglycerides: 81 mg/dL (ref <150)

## 2012-11-19 MED ORDER — PHENTERMINE-TOPIRAMATE ER 3.75-23 MG PO CP24: 1.0000 | ORAL_CAPSULE | Freq: Every day | ORAL | Status: DC

## 2012-11-19 MED ORDER — PHENTERMINE-TOPIRAMATE ER 7.5-46 MG PO CP24: 1.0000 | ORAL_CAPSULE | Freq: Every day | ORAL | Status: DC

## 2012-11-19 NOTE — Patient Instructions (Signed)

## 2012-11-19 NOTE — Progress Notes (Signed)
52 y.o. G2P1 MarriedCaucasianF here for annual exam.  No vaginal bleeding.  Had head and neck MRI 10/10.  Didn't know results.  D/W pt these.  She has f/u next week with Jule Ser, Oct 28th.  Had lumpectomy in July with Dr. Jamey Ripa.    No LMP recorded. Patient has had an ablation.          Sexually active: yes  The current method of family planning is vasectomy.    Exercising: yes  home exercising, cardio Smoker:  no  Health Maintenance: Pap: 11/05/11 neg, neg HR HPV History of abnormal Pap:  no MMG: 06/2012 Rt breast calcification, lumpectomy 7/14 with benign changes (calcifications) Colonoscopy: no   Declined having a colonoscopy BMD:  never TDaP:  11/97 Screening Labs: Hb today: 12.8 Urine today: neg   reports that she has never smoked. She has never used smokeless tobacco. She reports that she drinks about 0.5 ounces of alcohol per week. She reports that she does not use illicit drugs.  Past Medical History  Diagnosis Date  . Headache(784.0)     sinus  . Seasonal allergies   . Breast calcification, right 07/2012  . Dental crowns present   . Dental bridge present     lower  . Hypercholesteremia   . Infertility     h/o with second pregnancy    Past Surgical History  Procedure Laterality Date  . Cesarean section    . Hysteroscopy w/ endometrial ablation  11/09/2006  . Breast biopsy Right 08/09/2012    Procedure: BREAST BIOPSY WITH NEEDLE LOCALIZATION;  Surgeon: Currie Paris, MD;  Location: [facility];  Service: General;  Laterality: Right;  needle localization at breast center of [place] [facility]   . Hta      Current Outpatient Prescriptions  Medication Sig Dispense Refill  . atorvastatin (LIPITOR) 40 MG tablet Take 40 mg by mouth daily.       . baclofen (LIORESAL) 10 MG tablet       . fish oil-omega-3 fatty acids 1000 MG capsule Take 2 g by mouth daily.      Marland Kitchen HYDROcodone-acetaminophen (NORCO) 5-325 MG per tablet Take 1 tablet by mouth every 4 (four) hours as  needed for pain.  30 tablet  0  . metroNIDAZOLE (METROGEL) 1 % gel       . Multiple Vitamin (MULTIVITAMIN) tablet Take 1 tablet by mouth daily.      . naproxen sodium (ANAPROX) 220 MG tablet Take 220 mg by mouth 2 (two) times daily with a meal.      . vitamin B-12 (CYANOCOBALAMIN) 100 MCG tablet Take 50 mcg by mouth daily.      Marland Kitchen zolpidem (AMBIEN) 10 MG tablet as needed.       . carisoprodol (SOMA) 350 MG tablet Take 350 mg by mouth 4 (four) times daily as needed for muscle spasms.       No current facility-administered medications for this visit.    Family History  Problem Relation Age of Onset  . Cancer Sister     breast, bone  . Diabetes Paternal Grandfather   . Cancer Paternal Grandfather     unknown type  . Hypertension Mother   . Heart attack Paternal Grandmother   . Heart attack Mother 4  . Hypercholesterolemia Father     ROS:  Pertinent items are noted in HPI.  Otherwise, a comprehensive ROS was negative.  Exam:   BP 120/80  Pulse 76  Resp 18  Ht 5'  4.25" (1.632 m)  Wt 190 lb (86.183 kg)  BMI 32.36 kg/m2  Weight change: -7lbs  Height: 5' 4.25" (163.2 cm)  Ht Readings from Last 3 Encounters:  11/19/12 5' 4.25" (1.632 m)  09/15/12 5\' 5"  (1.651 m)  08/09/12 5\' 5"  (1.651 m)    General appearance: alert, cooperative and appears stated age Head: Normocephalic, without obvious abnormality, atraumatic Neck: no adenopathy, supple, symmetrical, trachea midline and thyroid normal to inspection and palpation Lungs: clear to auscultation bilaterally Breasts: normal appearance, no masses or tenderness Heart: regular rate and rhythm Abdomen: soft, non-tender; bowel sounds normal; no masses,  no organomegaly Extremities: extremities normal, atraumatic, no cyanosis or edema Skin: Skin color, texture, turgor normal. No rashes or lesions Lymph nodes: Cervical, supraclavicular, and axillary nodes normal. No abnormal inguinal nodes palpated Neurologic: Grossly  normal   Pelvic: External genitalia:  no lesions              Urethra:  normal appearing urethra with no masses, tenderness or lesions              Bartholins and Skenes: normal                 Vagina: normal appearing vagina with normal color and discharge, no lesions              Cervix: no lesions              Pap taken: yes Bimanual Exam:  Uterus:  normal size, contour, position, consistency, mobility, non-tender              Adnexa: normal adnexa and no mass, fullness, tenderness               Rectovaginal: Confirms               Anus:  normal sphincter tone, no lesions  A:  Well Woman with normal exam Amenorrhea, h/o ablation 10/08 Menopausal symptoms Family hx of breast cancer Desires weight loss  P:   Mammogram yearly.   pap smear only today Qsymia 3.75/23 daily for 14 days.  Increase to 7.5/46mg .  OV 4-6 weeks.  Discussed risks that include but are not limited to headache, nausea, increased BP, pulmonary hypertension.   Oc light given.  Declines colonoscopy. Declines tdap.  Knows overdue. TSH, CMP, Vit D, Lipids today return annually or prn  An After Visit Summary was printed and given to the patient.

## 2012-11-29 ENCOUNTER — Telehealth: Payer: Self-pay | Admitting: Obstetrics & Gynecology

## 2012-11-29 NOTE — Telephone Encounter (Signed)
Patient states she is returning [facility] call about results but there are no open phone notes. Routing call to triage.

## 2012-12-02 MED ORDER — AZITHROMYCIN 250 MG PO TABS: ORAL_TABLET | ORAL | Status: DC

## 2012-12-02 NOTE — Telephone Encounter (Signed)
Pt says she forgot to get a rx called in for a zpak from Dr.Miller.

## 2012-12-02 NOTE — Telephone Encounter (Signed)
Rx sent to pharmacy   

## 2012-12-02 NOTE — Telephone Encounter (Signed)
Dr Hyacinth Meeker, I am not sure if you and her talked about this rx being sent, please advise?

## 2012-12-03 NOTE — Telephone Encounter (Signed)
Spoke with patient and  She will pick up rx.

## 2012-12-10 ENCOUNTER — Telehealth: Payer: Self-pay | Admitting: Obstetrics & Gynecology

## 2012-12-10 NOTE — Telephone Encounter (Signed)
Pt wants to know if she can take over the counter estrogen. Pt states she has a history of breast cancer in her family.

## 2012-12-10 NOTE — Telephone Encounter (Signed)
Dr. Hyacinth Meeker, what do you advise for patients with family hx of Breast CA and otc estrogen use?

## 2012-12-13 MED ORDER — SERTRALINE HCL 50 MG PO TABS: ORAL_TABLET | ORAL | Status: DC

## 2012-12-13 NOTE — Telephone Encounter (Signed)
Dr. Hyacinth Meeker,  I spoke with patient and gave her your message. She is having hot flashes every night that wake her up and mood swings and states "sometimes I can't even stand myself!".  She wants to stay away from hormone therapy, but was wondering what you could prescribe for this. She denies any SI/HI thoughts.  What do you advise?

## 2012-12-13 NOTE — Telephone Encounter (Addendum)
Patient is agreeable to SSRI and to starting Zoloft. Requested order be sent to pharmacy. Advised one tablet daily for two weeks, if no improvement then may increase to two. Patient has f/u appointment for December. Advised to call with any concerns. Patient is agreeable to plan and verbalized understanding of instructions and need for follow up.

## 2012-12-13 NOTE — Telephone Encounter (Signed)
That is ultimately her decision.  Any estrogen increases the risk of breast cancer.  OTC estrogen is not going to be very strong so this will minimize her risk but it is still an estrogen so the risk isn't zero.  Hope that helps.

## 2012-12-13 NOTE — Telephone Encounter (Signed)
Does she want to try an SSRI.  This will help with hot flashes and mood and is non-hormonal.  I would start with Zoloft 50mg  daily for 2 weeks.  If no improvement, would increase to 100mg  daily.  Can send in RX.  This is an antidepressant but that isn't why I am giving it to her.  It has been proven to decrease hot flashes and is used in women who have breast cancer and can't take HRT.  What does she think?

## 2012-12-27 ENCOUNTER — Telehealth: Payer: Self-pay | Admitting: Obstetrics & Gynecology

## 2012-12-27 NOTE — Telephone Encounter (Signed)
Patient called and cancelled her recheck appointment 01/06/13 due to not taking the medication. FYI only.

## 2013-01-06 ENCOUNTER — Ambulatory Visit: Payer: BC Managed Care – PPO | Admitting: Obstetrics & Gynecology

## 2013-06-02 ENCOUNTER — Other Ambulatory Visit: Payer: Self-pay

## 2013-06-02 DIAGNOSIS — Z1231 Encounter for screening mammogram for malignant neoplasm of breast: Secondary | ICD-10-CM

## 2013-07-12 ENCOUNTER — Ambulatory Visit: Payer: BC Managed Care – PPO

## 2013-07-12 ENCOUNTER — Ambulatory Visit
Admission: RE | Admit: 2013-07-12 | Discharge: 2013-07-12 | Disposition: A | Discharge status: Home / Self Care | Payer: BC Managed Care – PPO | Source: Ambulatory Visit

## 2013-07-12 DIAGNOSIS — Z1231 Encounter for screening mammogram for malignant neoplasm of breast: Secondary | ICD-10-CM

## 2013-08-08 ENCOUNTER — Encounter: Payer: Self-pay | Admitting: Obstetrics & Gynecology

## 2013-11-28 ENCOUNTER — Encounter: Payer: Self-pay | Admitting: Obstetrics & Gynecology

## 2013-12-15 ENCOUNTER — Ambulatory Visit (INDEPENDENT_AMBULATORY_CARE_PROVIDER_SITE_OTHER): Payer: BC Managed Care – PPO | Admitting: Obstetrics & Gynecology

## 2013-12-15 ENCOUNTER — Encounter: Payer: Self-pay | Admitting: Obstetrics & Gynecology

## 2013-12-15 VITALS — BP 128/82 | HR 60 | Resp 16 | Ht 64.25 in | Wt 180.0 lb

## 2013-12-15 DIAGNOSIS — R6882 Decreased libido: Secondary | ICD-10-CM

## 2013-12-15 DIAGNOSIS — Z01419 Encounter for gynecological examination (general) (routine) without abnormal findings: Secondary | ICD-10-CM

## 2013-12-15 DIAGNOSIS — Z0000 Encounter for general adult medical examination without abnormal findings: Secondary | ICD-10-CM

## 2013-12-15 LAB — LIPID PANEL
Cholesterol: 230 mg/dL — ABNORMAL HIGH (ref 0–200)
HDL: 58 mg/dL (ref >39)
LDL CALC: 159 mg/dL — AB (ref 0–99)
TRIGLYCERIDES: 63 mg/dL (ref <150)
Total CHOL/HDL Ratio: 4 Ratio
VLDL: 13 mg/dL (ref 0–40)

## 2013-12-15 LAB — POCT URINALYSIS DIPSTICK
BILIRUBIN UA: NEGATIVE
Glucose, UA: NEGATIVE
KETONES UA: NEGATIVE
LEUKOCYTES UA: NEGATIVE
Nitrite, UA: NEGATIVE
Protein, UA: NEGATIVE
Urobilinogen, UA: NEGATIVE
pH, UA: 6.5

## 2013-12-15 LAB — THYROID PANEL WITH TSH
Free Thyroxine Index: 2 (ref 1.4–3.8)
T3 Uptake: 31 % (ref 22–35)
T4, Total: 6.4 ug/dL (ref 4.5–12.0)
TSH: 2.035 u[IU]/mL (ref 0.350–4.500)

## 2013-12-15 MED ORDER — DICLOFENAC SODIUM 1 % TD GEL: 2.0000 g | Freq: Four times a day (QID) | TRANSDERMAL | Status: DC

## 2013-12-15 MED ORDER — AZITHROMYCIN 250 MG PO TABS: ORAL_TABLET | ORAL | Status: DC

## 2013-12-15 NOTE — Progress Notes (Signed)
53 y.o. G2P1 MarriedCaucasianF here for annual exam.  Daughter just got engaged.  Hasn't set a date yet.  No vaginal bleeding.    Having more issues with hot flashes and night sweats.  Having pain with intercourse.  Feeling very dry.    Declines colonoscopy and any screening.  Pt having issues with tennis elbow.  Has been "prescribed" herbal remedy cream that hasn't really helped.  Requests another recommendation.  PCP:  Emmie Niemann.  Cholesterol was elevated three months ago.    No LMP recorded. Patient has had an ablation.          Sexually active: Yes.    The current method of family planning is vasectomy.    Exercising: Yes.    cardio and strength Smoker:  no  Health Maintenance: Pap:  11/19/12 WNL History of abnormal Pap:  no MMG:  07/12/13 3D-normal Colonoscopy:  none BMD:   none TDaP:  Years ago Screening Labs: today, Hb today: 12.8, Urine today: PH-6.5, RBC-trace   reports that she has never smoked. She has never used smokeless tobacco. She reports that she drinks about 0.6 oz of alcohol per week. She reports that she does not use illicit drugs.  Past Medical History  Diagnosis Date  . Headache(784.0)     sinus  . Seasonal allergies   . Breast calcification, right 07/2012  . Dental crowns present   . Dental bridge present     lower  . Hypercholesteremia   . Infertility     h/o with second pregnancy    Past Surgical History  Procedure Laterality Date  . Cesarean section    . Hysteroscopy w/ endometrial ablation  11/09/2006  . Breast biopsy Right 08/09/2012    Procedure: BREAST BIOPSY WITH NEEDLE LOCALIZATION;  Surgeon: Haywood Lasso, MD;  Location: [facility];  Service: General;  Laterality: Right;  needle localization at breast center of [place] [facility]   . Hta      Current Outpatient Prescriptions  Medication Sig Dispense Refill  . atorvastatin (LIPITOR) 10 MG tablet     . carisoprodol (SOMA) 350 MG tablet Take 350 mg by mouth 4 (four) times daily  as needed for muscle spasms.    . fish oil-omega-3 fatty acids 1000 MG capsule Take 2 g by mouth daily.    . metroNIDAZOLE (METROGEL) 1 % gel Apply topically daily.    . Multiple Vitamin (MULTIVITAMIN) tablet Take 1 tablet by mouth daily.    . vitamin B-12 (CYANOCOBALAMIN) 100 MCG tablet Take 50 mcg by mouth daily.    Marland Kitchen zolpidem (AMBIEN) 10 MG tablet as needed.     Marland Kitchen azithromycin (ZITHROMAX Z-PAK) 250 MG tablet Take 2 tablets on day one.  Then take one tablet daily to complete 5 days. 6 tablet 0   No current facility-administered medications for this visit.    Family History  Problem Relation Age of Onset  . Cancer Sister     breast, bone  . Diabetes Paternal Grandfather   . Cancer Paternal Grandfather     unknown type  . Hypertension Mother   . Heart attack Paternal Grandmother   . Heart attack Mother 24  . Hypercholesterolemia Father     ROS:  Pertinent items are noted in HPI.  Otherwise, a comprehensive ROS was negative.  Exam:   BP 128/82 mmHg  Pulse 60  Resp 16  Ht 5' 4.25" (1.632 m)  Wt 180 lb (81.647 kg)  BMI 30.65 kg/m2  Weight change: -10#  Height: 5' 4.25" (163.2 cm)  Ht Readings from Last 3 Encounters:  12/15/13 5' 4.25" (1.632 m)  11/19/12 5' 4.25" (1.632 m)  09/15/12 5\' 5"  (1.651 m)    General appearance: alert, cooperative and appears stated age Head: Normocephalic, without obvious abnormality, atraumatic Neck: no adenopathy, supple, symmetrical, trachea midline and thyroid normal to inspection and palpation Lungs: clear to auscultation bilaterally Breasts: normal appearance, no masses or tenderness Heart: regular rate and rhythm Abdomen: soft, non-tender; bowel sounds normal; no masses,  no organomegaly Extremities: extremities normal, atraumatic, no cyanosis or edema Skin: Skin color, texture, turgor normal. No rashes or lesions Lymph nodes: Cervical, supraclavicular, and axillary nodes normal. No abnormal inguinal nodes palpated Neurologic: Grossly  normal   Pelvic: External genitalia:  no lesions              Urethra:  normal appearing urethra with no masses, tenderness or lesions              Bartholins and Skenes: normal                 Vagina: normal appearing vagina with normal color and discharge, no lesions              Cervix: no lesions              Pap taken: No. Bimanual Exam:  Uterus:  normal size, contour, position, consistency, mobility, non-tender              Adnexa: normal adnexa and no mass, fullness, tenderness               Rectovaginal: Confirms               Anus:  normal sphincter tone, no lesions  A:  Well Woman with normal exam Amenorrhea, h/o ablation 10/08 Menopausal symptoms Family hx of breast cancer Microscopic hematuria on several dip u/a's in past.  Neg micro several times.  Tennis elbow Decreased libido  P: Mammogram yearly.  Pap normal 2014.  Neg pap with neg HR HPV 2013.  No pap today. Declines colonoscopy.  Declines doing guaiac testing.  Pt knows I disagree with her decision. Declines tdap. Knows overdue. TSH with panel, FLP today, Testosterone level Z pak rx given. Voltarin gel rx for pt to try.   Topical testosterone propionate 2% apply 1/4 tsp twice weekly.  #60grams/0RF return annually or prn  An After Visit Summary was printed and given to the patient.

## 2013-12-16 LAB — HEMOGLOBIN, FINGERSTICK: Hemoglobin, fingerstick: 12.8 g/dL (ref 12.0–16.0)

## 2013-12-16 LAB — TESTOSTERONE: TESTOSTERONE: 35 ng/dL (ref 10–70)

## 2013-12-21 ENCOUNTER — Telehealth: Payer: Self-pay | Admitting: Obstetrics & Gynecology

## 2013-12-21 NOTE — Telephone Encounter (Signed)
Routing to [place] for review as no telephone note and patient was notified of results this morning as seen below.  Notes Recorded by Maryann Conners, CMA on 12/20/2013 at 11:42 AM Patient notified of all results. Aware will send copy to Dr Deatra Ina and she should follow up with them. Had [facility] at their office in 8/15, so she will call for options.//kn Notes Recorded by Lyman Speller, MD on 12/17/2013 at 6:46 AM Also, testosterone is ok. I wanted to get a baseline since she is going to try topical testosterone. Thanks. Notes Recorded by Lyman Speller, MD on 12/17/2013 at 6:45 AM Inform thyroid and panel all normal. Cholesterol was elevated at 230. LD 159. HDs fine. Ratio is mildly elevated. Was this fasting? If not, should repeat. If so, pt needs to follow up with Emmie Niemann for consideration of options.

## 2013-12-21 NOTE — Telephone Encounter (Signed)
Spoke with patient, all information given re: testosterone. Patient did not make 3 month testosterone recheck at this time. She will call back to schedule this, states she might not start topical testosterone and then no need to recheck level.//kn

## 2013-12-21 NOTE — Telephone Encounter (Signed)
Patient says she is returning a call to [place]. No telephone note.

## 2014-02-06 NOTE — Telephone Encounter (Signed)
Okay to close encounter?  °

## 2014-02-06 NOTE — Telephone Encounter (Signed)
Yes, encounter closed.//kn

## 2014-04-25 ENCOUNTER — Telehealth: Payer: Self-pay

## 2014-04-25 NOTE — Telephone Encounter (Signed)
She needs to be seen at Urgent care or follow up with PCP if just took one two weeks ago.  Sorry.

## 2014-04-25 NOTE — Telephone Encounter (Signed)
Patient notified.//kn 

## 2014-04-25 NOTE — Telephone Encounter (Signed)
Call to patient to see if she has started the topical testosterone and will need 3 month lab recheck. Patient states has not started this medication and will call if she decides to. Aware will need lab work. Also, asking if Dr Sabra Heck will write Rx for Z-Pak, she just took the one she got at her [facility] about 2 weeks ago. Is still having sinus pressure, using Flonase with some relief. Please advise, aware will call her back if not able to fill this. Uses [facility] on [address].//kn

## 2014-04-25 NOTE — Telephone Encounter (Signed)
-----   Message from Maryann Conners, [place] sent at 02/06/2014  9:39 AM EST ----- ? If patient started topical testosterone and if yes will need 3 month recheck on labs.//kn

## 2014-07-05 ENCOUNTER — Other Ambulatory Visit: Payer: Self-pay

## 2014-07-05 DIAGNOSIS — Z1231 Encounter for screening mammogram for malignant neoplasm of breast: Secondary | ICD-10-CM

## 2014-08-04 ENCOUNTER — Other Ambulatory Visit (HOSPITAL_COMMUNITY): Payer: Self-pay

## 2014-08-04 DIAGNOSIS — Z1231 Encounter for screening mammogram for malignant neoplasm of breast: Secondary | ICD-10-CM

## 2014-08-14 ENCOUNTER — Ambulatory Visit: Payer: Self-pay

## 2014-08-16 ENCOUNTER — Ambulatory Visit (HOSPITAL_COMMUNITY)
Admission: RE | Admit: 2014-08-16 | Discharge: 2014-08-16 | Disposition: A | Discharge status: Home / Self Care | Payer: BLUE CROSS/BLUE SHIELD | Source: Ambulatory Visit | Attending: Family Medicine | Admitting: Family Medicine

## 2014-08-16 DIAGNOSIS — Z1231 Encounter for screening mammogram for malignant neoplasm of breast: Secondary | ICD-10-CM | POA: Insufficient documentation

## 2014-08-17 ENCOUNTER — Ambulatory Visit (HOSPITAL_COMMUNITY): Payer: Self-pay

## 2015-02-23 ENCOUNTER — Encounter: Payer: Self-pay | Admitting: Obstetrics & Gynecology

## 2015-02-23 ENCOUNTER — Ambulatory Visit (INDEPENDENT_AMBULATORY_CARE_PROVIDER_SITE_OTHER): Payer: BLUE CROSS/BLUE SHIELD | Admitting: Obstetrics & Gynecology

## 2015-02-23 VITALS — BP 110/80 | HR 64 | Resp 16 | Ht 64.5 in | Wt 180.0 lb

## 2015-02-23 DIAGNOSIS — Z0000 Encounter for general adult medical examination without abnormal findings: Secondary | ICD-10-CM

## 2015-02-23 DIAGNOSIS — Z01419 Encounter for gynecological examination (general) (routine) without abnormal findings: Secondary | ICD-10-CM | POA: Diagnosis not present

## 2015-02-23 DIAGNOSIS — E785 Hyperlipidemia, unspecified: Secondary | ICD-10-CM | POA: Insufficient documentation

## 2015-02-23 DIAGNOSIS — Z1211 Encounter for screening for malignant neoplasm of colon: Secondary | ICD-10-CM

## 2015-02-23 DIAGNOSIS — I10 Essential (primary) hypertension: Secondary | ICD-10-CM | POA: Diagnosis not present

## 2015-02-23 DIAGNOSIS — Z124 Encounter for screening for malignant neoplasm of cervix: Secondary | ICD-10-CM

## 2015-02-23 LAB — POCT URINALYSIS DIPSTICK
BILIRUBIN UA: NEGATIVE
Glucose, UA: NEGATIVE
Ketones, UA: NEGATIVE
LEUKOCYTES UA: NEGATIVE
Nitrite, UA: NEGATIVE
Protein, UA: NEGATIVE
UROBILINOGEN UA: NEGATIVE
pH, UA: 7

## 2015-02-23 MED ORDER — AZITHROMYCIN 250 MG PO TABS: ORAL_TABLET | ORAL | Status: DC

## 2015-02-23 NOTE — Progress Notes (Signed)
55 y.o. G2P1 MarriedCaucasianF here for annual exam.  Doing well.  No vaginal bleeding.  Daughter got married October 1st.  Daughter lives in [place].  She is going to take the [facility] to consider moving.   Having increased headache issues.  Having sinus pressure and drainage.    PCP:  Emmie Niemann.  Lab work done in the fall with her.  Pt reports LDLs were a little elevated.  Patient's last menstrual period was 10/28/2006 (approximate).          Sexually active: Yes.    The current method of family planning is vasectomy.    Exercising: Yes.    Cardio Smoker:  no  Health Maintenance: Pap:  11/19/12 Neg History of abnormal Pap:  no MMG:  08/17/14 BIRADS1:neg  Colonoscopy:  Never BMD:   Never TDaP:  06/2014 Screening Labs: PCP, Hb today: PCP, Urine today: RBC=Small (+)    reports that she has never smoked. She has never used smokeless tobacco. She reports that she does not drink alcohol or use illicit drugs.  Past Medical History  Diagnosis Date  . Headache(784.0)     sinus  . Seasonal allergies   . Breast calcification, right 07/2012  . Dental crowns present   . Dental bridge present     lower  . Hypercholesteremia   . Infertility     h/o with second pregnancy    Past Surgical History  Procedure Laterality Date  . Cesarean section    . Hysteroscopy w/ endometrial ablation  11/09/2006  . Breast biopsy Right 08/09/2012    Procedure: BREAST BIOPSY WITH NEEDLE LOCALIZATION;  Surgeon: Haywood Lasso, MD;  Location: [place];  Service: General;  Laterality: Right;  needle localization at breast center of [place] 10   . Hta      Current Outpatient Prescriptions  Medication Sig Dispense Refill  . atorvastatin (LIPITOR) 20 MG tablet Take 1 tablet by mouth daily.    . carisoprodol (SOMA) 350 MG tablet Take 350 mg by mouth 4 (four) times daily as needed for muscle spasms.    . fish oil-omega-3 fatty acids 1000 MG capsule Take 2 g by mouth daily.    . fluocinonide cream  (LIDEX) 0.05 %     . fluticasone (FLONASE) 50 MCG/ACT nasal spray     . hydrochlorothiazide (HYDRODIURIL) 25 MG tablet Take 1 tablet by mouth as needed.    . lidocaine (LIDODERM) 5 %     . meloxicam (MOBIC) 15 MG tablet Take 1 tablet by mouth daily as needed.    . metroNIDAZOLE (METROGEL) 1 % gel Apply topically daily.    . Multiple Vitamin (MULTIVITAMIN) tablet Take 1 tablet by mouth daily.    . vitamin B-12 (CYANOCOBALAMIN) 100 MCG tablet Take 50 mcg by mouth daily.    Marland Kitchen zolpidem (AMBIEN) 10 MG tablet as needed.      No current facility-administered medications for this visit.    Family History  Problem Relation Age of Onset  . Cancer Sister     breast, bone  . Diabetes Paternal Grandfather   . Cancer Paternal Grandfather     unknown type  . Hypertension Mother   . Heart attack Paternal Grandmother   . Heart attack Mother 51  . Hypercholesterolemia Father     ROS:  Pertinent items are noted in HPI.  Otherwise, a comprehensive ROS was negative.  Exam:   BP 110/80 mmHg  Pulse 64  Resp 16  Ht 5' 4.5" (  1.638 m)  Wt 180 lb (81.647 kg)  BMI 30.43 kg/m2  LMP 10/28/2006 (Approximate)  Weight change: stable   Height: 5' 4.5" (163.8 cm)  Ht Readings from Last 3 Encounters:  02/23/15 5' 4.5" (1.638 m)  12/15/13 5' 4.25" (1.632 m)  11/19/12 5' 4.25" (1.632 m)    General appearance: alert, cooperative and appears stated age Head: Normocephalic, without obvious abnormality, atraumatic Neck: no adenopathy, supple, symmetrical, trachea midline and thyroid normal to inspection and palpation Lungs: clear to auscultation bilaterally Breasts: normal appearance, no masses or tenderness Heart: regular rate and rhythm Abdomen: soft, non-tender; bowel sounds normal; no masses,  no organomegaly Extremities: extremities normal, atraumatic, no cyanosis or edema Skin: Skin color, texture, turgor normal. No rashes or lesions Lymph nodes: Cervical, supraclavicular, and axillary nodes  normal. No abnormal inguinal nodes palpated Neurologic: Grossly normal   Pelvic: External genitalia:  no lesions              Urethra:  normal appearing urethra with no masses, tenderness or lesions              Bartholins and Skenes: normal                 Vagina: normal appearing vagina with normal color and discharge, no lesions              Cervix: no lesions              Pap taken: Yes.   Bimanual Exam:  Uterus:  normal size, contour, position, consistency, mobility, non-tender              Adnexa: normal adnexa and no mass, fullness, tenderness               Rectovaginal: Confirms               Anus:  normal sphincter tone, no lesions  Chaperone was present for exam.  A:  Well Woman with normal exam Amenorrhea, h/o ablation 10/08 Family hx of breast cancer Microscopic hematuria on several dip u/a's in past. Neg micro several times Decreased libido Sinusitis  P: Mammogram yearly Pap normal 2014. Neg pap with neg HR HPV 2013.  Pap today. Declines colonoscopy. IFOB given Z pak given return annually or prn

## 2015-02-23 NOTE — Addendum Note (Signed)
Addended by: Megan Salon on: 02/23/2015 09:57 AM   Modules accepted: Miquel Dunn

## 2015-02-27 LAB — IPS PAP TEST WITH HPV

## 2015-03-19 LAB — FECAL OCCULT BLOOD, IMMUNOCHEMICAL: IFOBT: NEGATIVE

## 2015-09-19 ENCOUNTER — Other Ambulatory Visit: Payer: Self-pay | Admitting: Obstetrics & Gynecology

## 2015-09-19 DIAGNOSIS — Z1231 Encounter for screening mammogram for malignant neoplasm of breast: Secondary | ICD-10-CM

## 2015-09-25 ENCOUNTER — Ambulatory Visit
Admission: RE | Admit: 2015-09-25 | Discharge: 2015-09-25 | Disposition: A | Discharge status: Home / Self Care | Payer: BLUE CROSS/BLUE SHIELD | Source: Ambulatory Visit | Attending: Obstetrics & Gynecology | Admitting: Obstetrics & Gynecology

## 2015-09-25 DIAGNOSIS — Z1231 Encounter for screening mammogram for malignant neoplasm of breast: Secondary | ICD-10-CM

## 2015-09-26 ENCOUNTER — Ambulatory Visit: Payer: BLUE CROSS/BLUE SHIELD

## 2016-02-13 ENCOUNTER — Ambulatory Visit: Payer: BLUE CROSS/BLUE SHIELD | Admitting: Podiatry

## 2016-02-15 ENCOUNTER — Ambulatory Visit: Payer: Self-pay | Admitting: Podiatry

## 2016-06-19 ENCOUNTER — Encounter: Payer: Self-pay | Admitting: Obstetrics & Gynecology

## 2016-06-19 ENCOUNTER — Ambulatory Visit (INDEPENDENT_AMBULATORY_CARE_PROVIDER_SITE_OTHER): Payer: BLUE CROSS/BLUE SHIELD | Admitting: Obstetrics & Gynecology

## 2016-06-19 VITALS — BP 104/82 | HR 70 | Resp 16 | Ht 64.5 in | Wt 183.0 lb

## 2016-06-19 DIAGNOSIS — Z1211 Encounter for screening for malignant neoplasm of colon: Secondary | ICD-10-CM

## 2016-06-19 DIAGNOSIS — Z01419 Encounter for gynecological examination (general) (routine) without abnormal findings: Secondary | ICD-10-CM | POA: Diagnosis not present

## 2016-06-19 DIAGNOSIS — Z205 Contact with and (suspected) exposure to viral hepatitis: Secondary | ICD-10-CM | POA: Diagnosis not present

## 2016-06-19 LAB — HEPATITIS C ANTIBODY: HCV Ab: NEGATIVE

## 2016-06-19 NOTE — Progress Notes (Signed)
56 y.o. G2P1 MarriedCaucasianF here for annual exam.  Doing well.  Daughter is an Forensic psychologist in [place].  She's passed the [place] bar.  They are considering moving to [place].    Denies vaginal bleeding.    Patient's last menstrual period was 01/27/2006.          Sexually active: Yes.    The current method of family planning is vasectomy.    Exercising: Yes.    cardio, stregth  Smoker:  no  Health Maintenance: Pap:  02/23/15 Neg. HR HPV:neg   11/19/12 Neg  History of abnormal Pap:  no MMG:  09/25/15 BIRADS1:neg  Colonoscopy:  Never BMD:   Never TDaP:  6/16 Pneumonia vaccine(s):  No Zostavax:   No Hep C testing: No Screening Labs: PCP   reports that she has never smoked. She has never used smokeless tobacco. She reports that she does not drink alcohol or use drugs.  Past Medical History:  Diagnosis Date  . Breast calcification, right 07/2012  . Dental bridge present    lower  . Dental crowns present   . Headache(784.0)    sinus  . Hypercholesteremia   . Infertility    h/o with second pregnancy  . Seasonal allergies     Past Surgical History:  Procedure Laterality Date  . BREAST BIOPSY Right 08/09/2012   Procedure: BREAST BIOPSY WITH NEEDLE LOCALIZATION;  Surgeon: Haywood Lasso, MD;  Location: [place];  Service: General;  Laterality: Right;  needle localization at breast center of [place] 10   . CESAREAN SECTION    . HTA    . HYSTEROSCOPY W/ ENDOMETRIAL ABLATION  11/09/2006    Current Outpatient Prescriptions  Medication Sig Dispense Refill  . atorvastatin (LIPITOR) 20 MG tablet Take 1 tablet by mouth daily.    . carisoprodol (SOMA) 350 MG tablet Take 350 mg by mouth 4 (four) times daily as needed for muscle spasms.    . fish oil-omega-3 fatty acids 1000 MG capsule Take 2 g by mouth daily.    . fluocinonide cream (LIDEX) 0.05 %     . fluticasone (FLONASE) 50 MCG/ACT nasal spray     . hydrochlorothiazide (HYDRODIURIL) 25 MG tablet Take 1 tablet by mouth as  needed.    . Multiple Vitamin (MULTIVITAMIN) tablet Take 1 tablet by mouth daily.    . vitamin B-12 (CYANOCOBALAMIN) 100 MCG tablet Take 50 mcg by mouth daily.    Marland Kitchen zolpidem (AMBIEN) 10 MG tablet as needed.      No current facility-administered medications for this visit.     Family History  Problem Relation Age of Onset  . Hypertension Mother   . Heart attack Mother 53  . Hypercholesterolemia Father   . Cancer Sister        breast, bone  . Diabetes Paternal Grandfather   . Cancer Paternal Grandfather        unknown type  . Heart attack Paternal Grandmother     ROS:  Pertinent items are noted in HPI.  Otherwise, a comprehensive ROS was negative.  Exam:   BP 104/82 (BP Location: Right Arm, Patient Position: Sitting, Cuff Size: Large)   Pulse 70   Resp 16   Ht 5' 4.5" (1.638 m)   Wt 183 lb (83 kg)   LMP 01/27/2006   BMI 30.93 kg/m   Weight change:  +3#  Height: 5' 4.5" (163.8 cm)  Ht Readings from Last 3 Encounters:  06/19/16 5' 4.5" (1.638 m)  02/23/15 5' 4.5" (  1.638 m)  12/15/13 5' 4.25" (1.632 m)    General appearance: alert, cooperative and appears stated age Head: Normocephalic, without obvious abnormality, atraumatic Neck: no adenopathy, supple, symmetrical, trachea midline and thyroid normal to inspection and palpation Lungs: clear to auscultation bilaterally Breasts: normal appearance, no masses or tenderness Heart: regular rate and rhythm Abdomen: soft, non-tender; bowel sounds normal; no masses,  no organomegaly Extremities: extremities normal, atraumatic, no cyanosis or edema Skin: Skin color, texture, turgor normal. No rashes or lesions Lymph nodes: Cervical, supraclavicular, and axillary nodes normal. No abnormal inguinal nodes palpated Neurologic: Grossly normal   Pelvic: External genitalia:  no lesions              Urethra:  normal appearing urethra with no masses, tenderness or lesions              Bartholins and Skenes: normal                  Vagina: normal appearing vagina with normal color and discharge, no lesions              Cervix: no lesions              Pap taken: No. Bimanual Exam:  Uterus:  normal size, contour, position, consistency, mobility, non-tender              Adnexa: normal adnexa and no mass, fullness, tenderness               Rectovaginal: Confirms               Anus:  normal sphincter tone, no lesions  Chaperone was present for exam.  A:  Well Woman with normal exam PMP, no HRT  Family hx of breast cancer, sister diagnosed late 80's H/O microscopic hematuria on dip u/a.  Neg micro several times after the dip u/a's have been positive Elevated lipids Hypertension  P:   Mammogram guidelines reviewed.  Doing 3D MMG. pap smear and HR HPV neg 2017 Hep C antibody obtained today Lab work and vaccines are UTD Referral to Dr. Collene Mares for screening colononscopy Return annually or prn

## 2016-10-01 ENCOUNTER — Other Ambulatory Visit: Payer: Self-pay | Admitting: Obstetrics & Gynecology

## 2016-10-01 DIAGNOSIS — Z1231 Encounter for screening mammogram for malignant neoplasm of breast: Secondary | ICD-10-CM

## 2016-10-13 ENCOUNTER — Ambulatory Visit: Payer: BLUE CROSS/BLUE SHIELD

## 2016-10-14 ENCOUNTER — Ambulatory Visit: Payer: BLUE CROSS/BLUE SHIELD

## 2016-10-16 ENCOUNTER — Ambulatory Visit (INDEPENDENT_AMBULATORY_CARE_PROVIDER_SITE_OTHER): Payer: BLUE CROSS/BLUE SHIELD

## 2016-10-16 ENCOUNTER — Other Ambulatory Visit: Payer: Self-pay | Admitting: Podiatry

## 2016-10-16 ENCOUNTER — Encounter: Payer: Self-pay | Admitting: Podiatry

## 2016-10-16 ENCOUNTER — Ambulatory Visit (INDEPENDENT_AMBULATORY_CARE_PROVIDER_SITE_OTHER): Payer: BLUE CROSS/BLUE SHIELD | Admitting: Podiatry

## 2016-10-16 ENCOUNTER — Ambulatory Visit
Admission: RE | Admit: 2016-10-16 | Discharge: 2016-10-16 | Disposition: A | Discharge status: Home / Self Care | Payer: BLUE CROSS/BLUE SHIELD | Source: Ambulatory Visit | Attending: Obstetrics & Gynecology | Admitting: Obstetrics & Gynecology

## 2016-10-16 ENCOUNTER — Ambulatory Visit: Payer: BLUE CROSS/BLUE SHIELD

## 2016-10-16 ENCOUNTER — Ambulatory Visit: Payer: Self-pay

## 2016-10-16 VITALS — BP 143/87 | HR 58 | Resp 16

## 2016-10-16 DIAGNOSIS — M2011 Hallux valgus (acquired), right foot: Secondary | ICD-10-CM

## 2016-10-16 DIAGNOSIS — M779 Enthesopathy, unspecified: Secondary | ICD-10-CM

## 2016-10-16 DIAGNOSIS — M79671 Pain in right foot: Secondary | ICD-10-CM

## 2016-10-16 DIAGNOSIS — M7750 Other enthesopathy of unspecified foot: Secondary | ICD-10-CM | POA: Diagnosis not present

## 2016-10-16 DIAGNOSIS — M21619 Bunion of unspecified foot: Secondary | ICD-10-CM | POA: Diagnosis not present

## 2016-10-16 DIAGNOSIS — Z1231 Encounter for screening mammogram for malignant neoplasm of breast: Secondary | ICD-10-CM

## 2016-10-16 MED ORDER — TRIAMCINOLONE ACETONIDE 10 MG/ML IJ SUSP
10.0000 mg | Freq: Once | INTRAMUSCULAR | Status: AC
  Administered 2016-10-16: 10 mg

## 2016-10-16 NOTE — Progress Notes (Signed)
   Subjective:    Patient ID: Margaret Webb, female    DOB: Mar 24, 1960, 56 y.o.   MRN: [id number]  HPI Chief Complaint  Patient presents with  . Foot Pain    Bilateral; plantar forefoot; Right foot-Bunion; x10 months  . Toe Pain    Bilateral; 2nd toes; x10 months      Review of Systems  Musculoskeletal: Positive for back pain.  All other systems reviewed and are negative.      Objective:   Physical Exam        Assessment & Plan:

## 2016-10-16 NOTE — Patient Instructions (Signed)

## 2016-11-03 ENCOUNTER — Encounter: Payer: Self-pay | Admitting: Podiatry

## 2016-11-03 ENCOUNTER — Ambulatory Visit (INDEPENDENT_AMBULATORY_CARE_PROVIDER_SITE_OTHER): Payer: BLUE CROSS/BLUE SHIELD | Admitting: Podiatry

## 2016-11-03 DIAGNOSIS — M21619 Bunion of unspecified foot: Secondary | ICD-10-CM | POA: Diagnosis not present

## 2016-11-03 DIAGNOSIS — M779 Enthesopathy, unspecified: Secondary | ICD-10-CM | POA: Diagnosis not present

## 2016-11-03 NOTE — Patient Instructions (Signed)

## 2016-11-05 NOTE — Progress Notes (Signed)
Subjective:    Patient ID: Margaret Webb, female   DOB: 56 y.o.   MRN: [id number]   HPI    ROS      Objective:  Physical Exam     Assessment:         Plan:

## 2016-11-18 ENCOUNTER — Telehealth: Payer: Self-pay | Admitting: *Deleted

## 2016-11-18 NOTE — Telephone Encounter (Signed)
Pt states she was in a couple of weeks ago and had a cortisone shot and is still having pain in that left foot. I told pt if she was still having pain then we needed to get her in again to discuss a different treatment plan. Pt agreed and I transferred to schedulers.

## 2016-11-24 ENCOUNTER — Ambulatory Visit: Payer: BLUE CROSS/BLUE SHIELD

## 2016-11-24 ENCOUNTER — Other Ambulatory Visit: Payer: Self-pay | Admitting: Family Medicine

## 2016-11-24 DIAGNOSIS — R200 Anesthesia of skin: Secondary | ICD-10-CM

## 2016-11-24 DIAGNOSIS — R29898 Other symptoms and signs involving the musculoskeletal system: Secondary | ICD-10-CM

## 2016-11-24 DIAGNOSIS — M542 Cervicalgia: Secondary | ICD-10-CM

## 2016-11-26 ENCOUNTER — Ambulatory Visit
Admission: RE | Admit: 2016-11-26 | Discharge: 2016-11-26 | Disposition: A | Discharge status: Home / Self Care | Payer: BLUE CROSS/BLUE SHIELD | Source: Ambulatory Visit | Attending: Family Medicine | Admitting: Family Medicine

## 2016-11-26 DIAGNOSIS — R200 Anesthesia of skin: Secondary | ICD-10-CM

## 2016-11-26 DIAGNOSIS — R29898 Other symptoms and signs involving the musculoskeletal system: Secondary | ICD-10-CM

## 2016-11-26 DIAGNOSIS — M542 Cervicalgia: Secondary | ICD-10-CM

## 2016-11-27 ENCOUNTER — Ambulatory Visit: Payer: BLUE CROSS/BLUE SHIELD | Admitting: Podiatry

## 2016-12-03 ENCOUNTER — Other Ambulatory Visit: Payer: Self-pay | Admitting: Podiatry

## 2016-12-03 ENCOUNTER — Ambulatory Visit (INDEPENDENT_AMBULATORY_CARE_PROVIDER_SITE_OTHER): Payer: BLUE CROSS/BLUE SHIELD

## 2016-12-03 ENCOUNTER — Encounter: Payer: Self-pay | Admitting: Podiatry

## 2016-12-03 ENCOUNTER — Ambulatory Visit (INDEPENDENT_AMBULATORY_CARE_PROVIDER_SITE_OTHER): Payer: BLUE CROSS/BLUE SHIELD | Admitting: Podiatry

## 2016-12-03 DIAGNOSIS — M2042 Other hammer toe(s) (acquired), left foot: Secondary | ICD-10-CM | POA: Diagnosis not present

## 2016-12-03 DIAGNOSIS — M79672 Pain in left foot: Secondary | ICD-10-CM | POA: Diagnosis not present

## 2016-12-03 DIAGNOSIS — M2040 Other hammer toe(s) (acquired), unspecified foot: Secondary | ICD-10-CM

## 2016-12-03 DIAGNOSIS — M779 Enthesopathy, unspecified: Secondary | ICD-10-CM

## 2016-12-03 MED ORDER — TRIAMCINOLONE ACETONIDE 10 MG/ML IJ SUSP
10.0000 mg | Freq: Once | INTRAMUSCULAR | Status: AC
  Administered 2016-12-03: 10 mg

## 2016-12-03 MED ORDER — DICLOFENAC SODIUM 75 MG PO TBEC: 75.0000 mg | DELAYED_RELEASE_TABLET | Freq: Two times a day (BID) | ORAL | 2 refills | Status: DC

## 2016-12-03 NOTE — Progress Notes (Signed)
Subjective:    Patient ID: Margaret Webb, female   DOB: 56 y.o.   MRN: [id number]   HPI patient states that the third joint is really bothering her at this time and the second digit worked on previously seems to be some better but she is having a lot of pain with    ROS      Objective:  Physical Exam neurovascular status intact with patient's left third MPJ inflamed with fluid buildup and the second sore but not to the same degree as the third     Assessment:  Inflammatory capsulitis third MPJ left with improvement but pain still in the second 2       Plan:    H&P x-ray reviewed and today I did a proximal nerve block left I then aspirated the third MPJ and did find to be slightly bloody indicating there may been some damage to the joint previously. I then injected a quarter cc dexamethasone Kenalog and dispensed an air fracture walker to completely reduce pressure against the joint surface and advised this may ultimately require surgery to shorten the second and third metatarsal and possibly do digital corrections if symptoms do not get better. Also placed on diclofenac 75 mg twice a day  X-rays were negative for signs of fracture or other bone pathology

## 2016-12-24 ENCOUNTER — Ambulatory Visit: Payer: BLUE CROSS/BLUE SHIELD | Admitting: Podiatry

## 2017-05-06 ENCOUNTER — Telehealth: Payer: Self-pay | Admitting: Obstetrics & Gynecology

## 2017-05-06 NOTE — Telephone Encounter (Signed)
Spoke with patient and informed her that her appointment on 09/10/17 at 8:45am would need to be rescheduled. She stated that she could not reschedule at the moment and would call back when available.

## 2017-07-08 DIAGNOSIS — G4700 Insomnia, unspecified: Secondary | ICD-10-CM | POA: Insufficient documentation

## 2017-08-17 ENCOUNTER — Ambulatory Visit: Payer: BLUE CROSS/BLUE SHIELD | Admitting: Obstetrics & Gynecology

## 2017-09-10 ENCOUNTER — Ambulatory Visit: Payer: BLUE CROSS/BLUE SHIELD | Admitting: Obstetrics & Gynecology

## 2017-09-17 ENCOUNTER — Other Ambulatory Visit: Payer: Self-pay | Admitting: Obstetrics & Gynecology

## 2017-09-17 DIAGNOSIS — Z1231 Encounter for screening mammogram for malignant neoplasm of breast: Secondary | ICD-10-CM

## 2017-10-20 ENCOUNTER — Ambulatory Visit
Admission: RE | Admit: 2017-10-20 | Discharge: 2017-10-20 | Disposition: A | Discharge status: Home / Self Care | Payer: BLUE CROSS/BLUE SHIELD | Source: Ambulatory Visit | Attending: Obstetrics & Gynecology | Admitting: Obstetrics & Gynecology

## 2017-10-20 ENCOUNTER — Ambulatory Visit: Payer: BLUE CROSS/BLUE SHIELD

## 2017-10-20 DIAGNOSIS — Z1231 Encounter for screening mammogram for malignant neoplasm of breast: Secondary | ICD-10-CM

## 2017-11-05 ENCOUNTER — Ambulatory Visit (INDEPENDENT_AMBULATORY_CARE_PROVIDER_SITE_OTHER): Payer: BLUE CROSS/BLUE SHIELD | Admitting: Podiatry

## 2017-11-05 ENCOUNTER — Ambulatory Visit (INDEPENDENT_AMBULATORY_CARE_PROVIDER_SITE_OTHER): Payer: BLUE CROSS/BLUE SHIELD

## 2017-11-05 ENCOUNTER — Encounter: Payer: Self-pay | Admitting: Podiatry

## 2017-11-05 VITALS — BP 131/85 | HR 65

## 2017-11-05 DIAGNOSIS — M21611 Bunion of right foot: Secondary | ICD-10-CM | POA: Diagnosis not present

## 2017-11-05 DIAGNOSIS — M21619 Bunion of unspecified foot: Secondary | ICD-10-CM | POA: Diagnosis not present

## 2017-11-05 DIAGNOSIS — M2042 Other hammer toe(s) (acquired), left foot: Secondary | ICD-10-CM | POA: Diagnosis not present

## 2017-11-05 DIAGNOSIS — M7672 Peroneal tendinitis, left leg: Secondary | ICD-10-CM | POA: Diagnosis not present

## 2017-11-05 DIAGNOSIS — M779 Enthesopathy, unspecified: Secondary | ICD-10-CM

## 2017-11-05 DIAGNOSIS — M7751 Other enthesopathy of right foot: Secondary | ICD-10-CM | POA: Diagnosis not present

## 2017-11-05 DIAGNOSIS — M7752 Other enthesopathy of left foot: Secondary | ICD-10-CM

## 2017-11-05 MED ORDER — TRIAMCINOLONE ACETONIDE 10 MG/ML IJ SUSP
10.0000 mg | Freq: Once | INTRAMUSCULAR | Status: AC
  Administered 2017-11-05: 10 mg

## 2017-11-05 NOTE — Patient Instructions (Signed)

## 2017-11-05 NOTE — Progress Notes (Signed)
Subjective:   Patient ID: Margaret Webb, female   DOB: 57 y.o.   MRN: [id number]   HPI Patient states she is getting increased pain underneath the second metatarsal and third metatarsal admits she should have been here earlier but she just was not able to with her schedule and the second toe continues to move in an elevated direction and is causing increased pain.  Patient also has moderate bunion deformity left and right and states it is getting more more irritated and pain in the outside of the left foot where she is walking differently   ROS      Objective:  Physical Exam  Neurovascular status intact with patient found to have several year history of inflammation pain around the second and third metatarsal phalangeal joint with continued dorsal lifting of the second toe rigid contracture moderate peroneal tendinitis left and structural bunion deformity left and right with irritation of the nerve with fluid buildup around the first MPJ right foot.     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  She wants surgery and I did review with her the surgery that will be necessary and the correction that will be required including shortening osteotomy second and third metatarsal digital fusion digit to left and modified McBride bunionectomy left.  Today I am trying to get her out of pain and I did do a proximal nerve block left aspirated the second MPJ getting out a small amount of clear fluid and injected with quarter cc dexamethasone Kenalog and I injected the peroneal tendon at its insertion 3 mg Kenalog 5 Milgram  Xylocaine in the first MPJ right capsule on the medial side 3 mg Kenalog 5 mg Xylocaine.  Patient will be seen back for [place] to recheck again surgery scheduled for December  X-rays indicate significant elevation of the second digit left over right with elongated metatarsals and mild structural bunion deformity bilateral

## 2017-11-10 ENCOUNTER — Telehealth: Payer: Self-pay | Admitting: *Deleted

## 2017-11-10 NOTE — Telephone Encounter (Signed)
"  I am tentatively scheduled for surgery on December 3.  I would like to move that up."  What date do you have in mind to reschedule it?  "I'd like to do it on November 5."  I'll get it rescheduled.  "Do I need to reschedule my appointment to see Dr. Paulla Dolly prior to the surgery?  It's scheduled for a week before my old surgery date."  Yes, you need to reschedule that appointment to see Dr. Paulla Dolly.  "Can you do that or do I need to call back to the main number?"  I'll transfer you to a scheduler.  I transferred the call to [facility].

## 2017-11-23 ENCOUNTER — Ambulatory Visit (INDEPENDENT_AMBULATORY_CARE_PROVIDER_SITE_OTHER): Payer: BLUE CROSS/BLUE SHIELD | Admitting: Podiatry

## 2017-11-23 ENCOUNTER — Encounter: Payer: Self-pay | Admitting: Podiatry

## 2017-11-23 DIAGNOSIS — M779 Enthesopathy, unspecified: Secondary | ICD-10-CM | POA: Diagnosis not present

## 2017-11-23 DIAGNOSIS — M21619 Bunion of unspecified foot: Secondary | ICD-10-CM | POA: Diagnosis not present

## 2017-11-23 DIAGNOSIS — M2042 Other hammer toe(s) (acquired), left foot: Secondary | ICD-10-CM | POA: Diagnosis not present

## 2017-11-23 NOTE — Patient Instructions (Signed)
Pre-Operative Instructions  Congratulations, you have decided to take an important step towards improving your quality of life.  You can be assured that the doctors and staff at [facility] will be with you every step of the way.  Here are some important things you should know:  1. Plan to be at the surgery center/hospital at least 1 (one) hour prior to your scheduled time, unless otherwise directed by the surgical center/hospital staff.  You must have a responsible adult accompany you, remain during the surgery and drive you home.  Make sure you have directions to the surgical center/hospital to ensure you arrive on time. 2. If you are having surgery at [facility] or [facility] hospitals, you will need a copy of your medical history and physical form from your family physician within one month prior to the date of surgery. We will give you a form for your primary physician to complete.  3. We make every effort to accommodate the date you request for surgery.  However, there are times where surgery dates or times have to be moved.  We will contact you as soon as possible if a change in schedule is required.   4. No aspirin/ibuprofen for one week before surgery.  If you are on aspirin, any non-steroidal anti-inflammatory medications (Mobic, Aleve, Ibuprofen) should not be taken seven (7) days prior to your surgery.  You make take Tylenol for pain prior to surgery.  5. Medications - If you are taking daily heart and blood pressure medications, seizure, reflux, allergy, asthma, anxiety, pain or diabetes medications, make sure you notify the surgery center/hospital before the day of surgery so they can tell you which medications you should take or avoid the day of surgery. 6. No food or drink after midnight the night before surgery unless directed otherwise by surgical center/hospital staff. 7. No alcoholic beverages 24-hours prior to surgery.  No smoking 24-hours prior or 24-hours after  surgery. 8. Wear loose pants or shorts. They should be loose enough to fit over bandages, boots, and casts. 9. Don't wear slip-on shoes. Sneakers are preferred. 10. Bring your boot with you to the surgery center/hospital.  Also bring crutches or a walker if your physician has prescribed it for you.  If you do not have this equipment, it will be provided for you after surgery. 11. If you have not been contacted by the surgery center/hospital by the day before your surgery, call to confirm the date and time of your surgery. 12. Leave-time from work may vary depending on the type of surgery you have.  Appropriate arrangements should be made prior to surgery with your employer. 13. Prescriptions will be provided immediately following surgery by your doctor.  Fill these as soon as possible after surgery and take the medication as directed. Pain medications will not be refilled on weekends and must be approved by the doctor. 14. Remove nail polish on the operative foot and avoid getting pedicures prior to surgery. 15. Wash the night before surgery.  The night before surgery wash the foot and leg well with water and the antibacterial soap provided. Be sure to pay special attention to beneath the toenails and in between the toes.  Wash for at least three (3) minutes. Rinse thoroughly with water and dry well with a towel.  Perform this wash unless told not to do so by your physician.  Enclosed: 1 Ice pack (please put in freezer the night before surgery)   1 Hibiclens skin cleaner     Pre-op instructions  If you have any questions regarding the instructions, please do not hesitate to call our office.  [place]: [address], [place], [place] [address] -- [phone]  [place]: [address] [place], [place] [address] -- [phone]  [place]: [address]  [place], [place] [address] -- [phone]  [place]: [address], [address], [place], [place] [address] -- [phone]  Website:  [email or web address] 

## 2017-11-23 NOTE — Progress Notes (Signed)
Subjective:   Patient ID: Margaret Webb, female   DOB: 57 y.o.   MRN: [id number]   HPI Patient presents for correction of the left foot states is been very tender and making it hard to walk.  States the injection helped temporarily but it is come back again and they just get back middle ear having more pain associated with this and also is concerned about the bunion on both the left and the right foot.  Patient states that she is walking differently developing other pains in her foot   ROS      Objective:  Physical Exam  Vascular status intact with structural bunion deformity and third metatarsal phalangeal joint left with elevated second toe that is moderately tender when palpated.  Patient has good digital perfusion and is well oriented x3     Assessment:  Chronic capsulitis second and third metatarsal phalangeal joints left with elevated second digit with moderate structural bunion deformity right and left     Plan:  H&P conditions reviewed and discussed procedure to shorten the metatarsals 2 and 3.  I explained the procedure and risk and patient wants surgery understanding risk and is willing to accept the risk of surgery.  I allowed patient to read consent form at great length discussing a McBride type bunionectomy along with shortening osteotomy second third metatarsal digital fusion second toe explaining there is no guarantee as to the final position of the second toe and all complication as outlined.  Patient signed consent form after extensive review scheduled for outpatient surgery and is given preoperative instructions understanding recovery can take 6 months to 1 year with no final guarantee as to position of the second toe

## 2017-11-25 ENCOUNTER — Ambulatory Visit: Payer: BLUE CROSS/BLUE SHIELD | Admitting: Podiatry

## 2017-11-27 ENCOUNTER — Telehealth: Payer: Self-pay | Admitting: *Deleted

## 2017-11-27 HISTORY — PX: CORRECTION HAMMER TOE: SUR317

## 2017-11-27 NOTE — Telephone Encounter (Signed)
"  I am scheduled for surgery on November 5.  The nurse told me to call you to confirm that appointment.  If you can call me back to confirm, that would be nice.  Thank you and have a great day."

## 2017-11-30 NOTE — Telephone Encounter (Signed)
I called the patient on Friday, 11/27/17, and informed her that we have her scheduled for surgery on 12/01/2017.

## 2017-12-01 ENCOUNTER — Encounter: Payer: Self-pay | Admitting: Podiatry

## 2017-12-01 ENCOUNTER — Telehealth: Payer: Self-pay | Admitting: Podiatry

## 2017-12-01 DIAGNOSIS — M21541 Acquired clubfoot, right foot: Secondary | ICD-10-CM

## 2017-12-01 DIAGNOSIS — M2041 Other hammer toe(s) (acquired), right foot: Secondary | ICD-10-CM | POA: Diagnosis not present

## 2017-12-01 DIAGNOSIS — M2011 Hallux valgus (acquired), right foot: Secondary | ICD-10-CM | POA: Diagnosis not present

## 2017-12-01 NOTE — Telephone Encounter (Signed)
This is Animal nutritionist. Mr. Schmiesing just dropped off a prescription for percocet 10-325. It is written for 25 tablets, but instructions are over MME's. Wanting to know if we can switch it to 3 tablets a day and she would only be able to have 21 tablets instead of 25. Please call us back at [phone]. Pt's husband is waiting in store to pick this up.

## 2017-12-01 NOTE — Telephone Encounter (Signed)
I informed [facility] [employer] manager Dr. Josephina Shih Percocet 10/325mg  #21 allowing only 3 tablets a day.

## 2017-12-02 ENCOUNTER — Telehealth: Payer: Self-pay | Admitting: *Deleted

## 2017-12-02 NOTE — Telephone Encounter (Signed)
Pt states she spoke with the surgical center and was told to call our office to discuss surgical boot wear. I told pt she is to be in the boot at all times, especially when walking or sleeping, but can remove the boot if elevating and resting, but not going to sleep. Pt states understanding.

## 2017-12-07 ENCOUNTER — Encounter: Payer: Self-pay | Admitting: Podiatry

## 2017-12-07 ENCOUNTER — Ambulatory Visit (INDEPENDENT_AMBULATORY_CARE_PROVIDER_SITE_OTHER): Payer: BLUE CROSS/BLUE SHIELD | Admitting: Podiatry

## 2017-12-07 ENCOUNTER — Ambulatory Visit: Payer: BLUE CROSS/BLUE SHIELD | Admitting: Obstetrics & Gynecology

## 2017-12-07 ENCOUNTER — Ambulatory Visit (INDEPENDENT_AMBULATORY_CARE_PROVIDER_SITE_OTHER): Payer: BLUE CROSS/BLUE SHIELD

## 2017-12-07 VITALS — Temp 97.7°F

## 2017-12-07 DIAGNOSIS — M21619 Bunion of unspecified foot: Secondary | ICD-10-CM

## 2017-12-07 DIAGNOSIS — M21612 Bunion of left foot: Secondary | ICD-10-CM | POA: Diagnosis not present

## 2017-12-07 DIAGNOSIS — M2042 Other hammer toe(s) (acquired), left foot: Secondary | ICD-10-CM

## 2017-12-07 MED ORDER — OXYCODONE-ACETAMINOPHEN 10-325 MG PO TABS: 1.0000 | ORAL_TABLET | ORAL | 0 refills | Status: DC | PRN

## 2017-12-08 ENCOUNTER — Ambulatory Visit (INDEPENDENT_AMBULATORY_CARE_PROVIDER_SITE_OTHER): Payer: BLUE CROSS/BLUE SHIELD | Admitting: Podiatry

## 2017-12-08 ENCOUNTER — Telehealth: Payer: Self-pay | Admitting: Podiatry

## 2017-12-08 DIAGNOSIS — R609 Edema, unspecified: Secondary | ICD-10-CM

## 2017-12-08 DIAGNOSIS — M21619 Bunion of unspecified foot: Secondary | ICD-10-CM

## 2017-12-08 DIAGNOSIS — M2042 Other hammer toe(s) (acquired), left foot: Secondary | ICD-10-CM

## 2017-12-08 MED ORDER — CEPHALEXIN 500 MG PO CAPS: 500.0000 mg | ORAL_CAPSULE | Freq: Three times a day (TID) | ORAL | 0 refills | Status: DC

## 2017-12-08 NOTE — Telephone Encounter (Signed)
I called pt and asked if she could come in today and let the clinical nurse check. Pt states she can be in about 30 minute. I told pt I would put her on the Nurses schedule to be evaluated for possible dressing change or reinforcement of the dressing.

## 2017-12-08 NOTE — Progress Notes (Signed)
Patient is here today with concern about swelling in her foot.  She is status post bunion correction left, shortening osteotomy with screw second third left, fusion with pin second toe left date of surgery 12/01/2017.  She said she went to work yesterday and try to prop her foot up, and has been having increased pain and swelling since then. She does state that she was on her foot more yesterday.  She states that she did bleed through the bandage that was put on her foot.  Noted moderate swelling to the foot with erythema around surgical wounds.  She denies fever and chills, but does complain of nausea at times.  There is no purulent drainage, and the bleeding has stopped at this time.  She is evaluated by Dr. Jacqualyn Posey who prescribed Keflex 500 mg 3 times daily x10 days, patient is afebrile temperature at 98.3, all other vital signs are stable.    Sterile compressive bandage was applied, she was advised to ice and elevate and keep the foot dry.  She is to check her temperature daily and report any signs and symptoms of infection.  She is to follow-up next week or sooner with any acute symptom changes and remain her boot at all times. If any worsening signs or symptoms of infection to go the ER.

## 2017-12-08 NOTE — Telephone Encounter (Signed)
Patient called back stating that the bleeding has soaked through the wrap and her sock. Her foot is also throbbing now. Pt concerned if she needs to come in and have it checked.

## 2017-12-08 NOTE — Telephone Encounter (Signed)
Patient had surgery last week and is experiencing some bleeding. Please give patient a call

## 2017-12-09 NOTE — Progress Notes (Signed)
Subjective:   Patient ID: Margaret Webb, female   DOB: 57 y.o.   MRN: [id number]   HPI Patient presents stating I am doing pretty well and I have been relatively active and I do feel like my foot is swelling somewhat after surgery.  6 days after having forefoot reconstruction left   ROS      Objective:  Physical Exam  Neurovascular status intact with patient's left foot healing well with mild edema in the forefoot is patient's been active with wound edges well coapted no drainage and slight elevation of the second digit left     Assessment:  Overall doing well with wound edges well coapted mild elevation second toe pin in place and diminished plantar pain noted with negative Homans sign noted     Plan:  H&P x-ray reviewed.  Today reapplied sterile dressing advised on continued immobilization elevation compression and pain medication as needed and reappoint 2 weeks or earlier if any issues should occur  X-ray indicates fixation is in place screws in place with pin fixation in good alignment second digit

## 2017-12-11 ENCOUNTER — Ambulatory Visit (INDEPENDENT_AMBULATORY_CARE_PROVIDER_SITE_OTHER): Payer: BLUE CROSS/BLUE SHIELD

## 2017-12-11 ENCOUNTER — Ambulatory Visit (INDEPENDENT_AMBULATORY_CARE_PROVIDER_SITE_OTHER): Payer: Self-pay

## 2017-12-11 ENCOUNTER — Telehealth: Payer: Self-pay | Admitting: Podiatry

## 2017-12-11 DIAGNOSIS — M2042 Other hammer toe(s) (acquired), left foot: Secondary | ICD-10-CM

## 2017-12-11 DIAGNOSIS — M21619 Bunion of unspecified foot: Secondary | ICD-10-CM

## 2017-12-11 NOTE — Telephone Encounter (Signed)
Pt had surgery last week and has a pin in toe. Pt hit the area yesterday and for a couple hours last night it was throbbing. Pt wants to know if she should be concerned. Please give her a call.

## 2017-12-11 NOTE — Telephone Encounter (Signed)
I called pt, states the toe with the pin is throbbing but not as bad as midnight last night. I asked pt is there was any swelling or drainage. Pt states Janett Billow, RN redressed and told her not to remove the dressing. Dr. Jacqualyn Posey states have pt come in to see Janett Billow, RN. Pt was scheduled for 11:45am today.

## 2017-12-15 NOTE — Progress Notes (Signed)
Patient is here today stating that she recently bumped her foot on the chair yesterday and is concerned with the K wire that is in her second toe.  She states she had a slight increase in pain, but the pain has resolved since the injury.  Recent procedure performed post bunion correction surgery left, shortening osteotomy with screw third left, fusion with pin second toe, date of surgery 12/01/2017.  Overall the swelling in the foot has improved, the redness around the incisions has improved.  There does not appear to be any signs or symptoms of infection, no erythema, no redness, no drainage coming from the wound, no gapping noted.  X-rays were obtained and will be reviewed by Dr. Paulla Dolly, K wire pin in second toe in place with no movement noted.  Discussed signs and symptoms of infection.  I did advise the patient to continue with ice, compression, and elevation, and remaining in her boot at all times.  She is to keep her foot dry until we see her next week for reevaluation.  If she has any acute symptom changes she is to follow-up sooner.

## 2017-12-17 ENCOUNTER — Encounter: Payer: Self-pay | Admitting: Podiatry

## 2017-12-17 ENCOUNTER — Ambulatory Visit (INDEPENDENT_AMBULATORY_CARE_PROVIDER_SITE_OTHER): Payer: BLUE CROSS/BLUE SHIELD | Admitting: Podiatry

## 2017-12-17 DIAGNOSIS — M2042 Other hammer toe(s) (acquired), left foot: Secondary | ICD-10-CM | POA: Diagnosis not present

## 2017-12-17 DIAGNOSIS — M21619 Bunion of unspecified foot: Secondary | ICD-10-CM

## 2017-12-18 ENCOUNTER — Ambulatory Visit: Payer: BLUE CROSS/BLUE SHIELD | Admitting: Podiatry

## 2017-12-18 NOTE — Progress Notes (Signed)
Subjective:   Patient ID: Margaret Webb, female   DOB: 57 y.o.   MRN: [id number]   HPI Patient presents I am improving a lot in the symptoms and the swelling has really go down my second toe is slightly up in the air but overall and very pleased   ROS      Objective:  Physical Exam  Neurovascular status intact with incisions of the left first metatarsal and midfoot that are healing very well with no drainage noted wound edges well coapted and edema that is reduced significantly.  Digits are in reasonably good alignment with pin in place second toe     Assessment:  Doing well with reduced edema with mild elevation second digit left     Plan:  Advised on the importance of range of motion exercises and at this point I did dispense BioSkin to lower the second toe and keep this toe in good position.  I explained the above ankle utilization of this along with continued compression elevation immobilization and patient will be seen back in 3 weeks or earlier if needed  X-rays indicate osteotomies are healing well fixation is in good position with toe mildly elevated

## 2018-01-01 ENCOUNTER — Ambulatory Visit (INDEPENDENT_AMBULATORY_CARE_PROVIDER_SITE_OTHER): Payer: BLUE CROSS/BLUE SHIELD

## 2018-01-01 ENCOUNTER — Encounter: Payer: Self-pay | Admitting: Podiatry

## 2018-01-01 ENCOUNTER — Ambulatory Visit (INDEPENDENT_AMBULATORY_CARE_PROVIDER_SITE_OTHER): Payer: BLUE CROSS/BLUE SHIELD | Admitting: Podiatry

## 2018-01-01 DIAGNOSIS — M2042 Other hammer toe(s) (acquired), left foot: Secondary | ICD-10-CM

## 2018-01-03 NOTE — Progress Notes (Signed)
Subjective:   Patient ID: Margaret Webb, female   DOB: 57 y.o.   MRN: [id number]   HPI Patient presents for pin removal second digit left very happy with how her splint is doing   ROS      Objective:  Physical Exam  Neurovascular status intact with patient's left foot healing very well wound edges well coapted digit in good alignment with slight elevation with pin in place second toe     Assessment:  Doing very well post fusion digits 2  left and metatarsal osteotomy second third metatarsals left     Plan:  Pin removed second digit left and applied sterile dressing and instructed on continued splint usage and gradual return to soft shoe and dispensed ankle compression stocking and explained continued importance of elevation  X-ray indicates osteotomies are healing well digit in good alignment and healing well

## 2018-01-04 ENCOUNTER — Encounter: Payer: BLUE CROSS/BLUE SHIELD | Admitting: Podiatry

## 2018-02-12 ENCOUNTER — Encounter: Payer: Self-pay | Admitting: Podiatry

## 2018-02-12 ENCOUNTER — Ambulatory Visit (INDEPENDENT_AMBULATORY_CARE_PROVIDER_SITE_OTHER): Payer: BLUE CROSS/BLUE SHIELD

## 2018-02-12 ENCOUNTER — Ambulatory Visit (INDEPENDENT_AMBULATORY_CARE_PROVIDER_SITE_OTHER): Payer: BLUE CROSS/BLUE SHIELD | Admitting: Podiatry

## 2018-02-12 DIAGNOSIS — M2042 Other hammer toe(s) (acquired), left foot: Secondary | ICD-10-CM

## 2018-02-12 DIAGNOSIS — M7672 Peroneal tendinitis, left leg: Secondary | ICD-10-CM

## 2018-02-12 MED ORDER — TRIAMCINOLONE ACETONIDE 10 MG/ML IJ SUSP
10.0000 mg | Freq: Once | INTRAMUSCULAR | Status: AC
  Administered 2018-02-12: 10 mg

## 2018-02-12 NOTE — Progress Notes (Signed)
Subjective:   Patient ID: Margaret Webb, female   DOB: 58 y.o.   MRN: [id number]   HPI Patient presents stating my left foot is doing well overall with swelling still persisting and discomfort of a moderate to long and I cannot wear certain shoes and I want my right bunion fixed in April.  Also complaining of pain in the outside of the left foot with inflammation stating that it is sore when she tries to be active   ROS      Objective:  Physical Exam  Neurovascular status intact with patient found to have well-healing surgical site right second metatarsal second digit first metatarsal with good range of motion and stiffness of the second digit normal because we did fuse the toe.  Patient is found to have quite a bit of inflammation on the lateral side of the left foot that is moderately painful and does have bunion deformity right with discomfort     Assessment:  Doing well post forefoot reconstruction left with peroneal tendinitis that is symptomatic left and bunion deformity right     Plan:  H&P discussed both conditions.  At this point for the left I did advise on range of motion exercises January 2 lower the second toe with brace.  I did careful steroid injection lateral side left foot 3 mg Kenalog 5 mg Xylocaine and for the right discussed bunion which she wants done after April 15 and she will be back for consult in the next several months  X-ray left indicates osteotomies are healing well fixation in place

## 2018-03-18 ENCOUNTER — Other Ambulatory Visit (HOSPITAL_COMMUNITY)
Admission: RE | Admit: 2018-03-18 | Discharge: 2018-03-18 | Disposition: A | Discharge status: Home / Self Care | Payer: BLUE CROSS/BLUE SHIELD | Source: Ambulatory Visit | Attending: Obstetrics & Gynecology | Admitting: Obstetrics & Gynecology

## 2018-03-18 ENCOUNTER — Ambulatory Visit (INDEPENDENT_AMBULATORY_CARE_PROVIDER_SITE_OTHER): Payer: BLUE CROSS/BLUE SHIELD | Admitting: Obstetrics & Gynecology

## 2018-03-18 ENCOUNTER — Other Ambulatory Visit: Payer: Self-pay

## 2018-03-18 ENCOUNTER — Encounter: Payer: Self-pay | Admitting: Obstetrics & Gynecology

## 2018-03-18 VITALS — BP 132/86 | HR 64 | Resp 16 | Ht 64.25 in | Wt 194.8 lb

## 2018-03-18 DIAGNOSIS — Z124 Encounter for screening for malignant neoplasm of cervix: Secondary | ICD-10-CM

## 2018-03-18 DIAGNOSIS — Z0000 Encounter for general adult medical examination without abnormal findings: Secondary | ICD-10-CM

## 2018-03-18 DIAGNOSIS — Z01419 Encounter for gynecological examination (general) (routine) without abnormal findings: Secondary | ICD-10-CM | POA: Diagnosis not present

## 2018-03-18 MED ORDER — MIRABEGRON ER 25 MG PO TB24: 25.0000 mg | ORAL_TABLET | Freq: Every day | ORAL | 2 refills | Status: DC

## 2018-03-18 NOTE — Progress Notes (Signed)
58 y.o. G2P1 Married White or Caucasian female here for annual exam.  Had foot surgery in November for hammer toe and bunionectomy.    Denies vaginal bleeding.  Daughter moved to [place].  She is doing fertility treatment.    Went on 10 day trip to [place] in the fall.    Having some issues with urinary urgency.  No leakage.  Voids and then feels like she needs to void just a few minutes later.  Getting up a little more at night as well.  No LMP recorded. Patient has had an ablation.          Sexually active: Yes.    The current method of family planning is post menopausal status.    Exercising: Yes.    cardio Smoker:  no  Health Maintenance: Pap:  02/23/15 Neg. HR HPV:Neg   11/19/12 neg  History of abnormal Pap:  no MMG:  10/20/17 BIRADS1:neg  Colonoscopy:  10/31/16 normal. F/u 10 years  BMD:   Never TDaP:  2016 Pneumonia vaccine(s):  n/a Shingrix:   No Hep C testing: 06/19/16 Neg  Screening Labs: Here today - fasting    reports that she has never smoked. She has never used smokeless tobacco. She reports that she does not drink alcohol or use drugs.  Past Medical History:  Diagnosis Date  . Breast calcification, right 07/2012  . Dental bridge present    lower  . Dental crowns present   . Headache(784.0)    sinus  . Hypercholesteremia   . Infertility    h/o with second pregnancy  . Seasonal allergies     Past Surgical History:  Procedure Laterality Date  . BREAST BIOPSY Right 08/09/2012   Procedure: BREAST BIOPSY WITH NEEDLE LOCALIZATION;  Surgeon: Haywood Lasso, MD;  Location: [facility];  Service: General;  Laterality: Right;  needle localization at breast center of [facility]   . CESAREAN SECTION    . HYSTEROSCOPY W/ ENDOMETRIAL ABLATION  11/09/2006    Current Outpatient Medications  Medication Sig Dispense Refill  . atorvastatin (LIPITOR) 40 MG tablet Take 40 mg by mouth daily.    . fish oil-omega-3 fatty acids 1000 MG capsule Take 2 g by mouth  daily.    . fluticasone (FLONASE) 50 MCG/ACT nasal spray     . hydrochlorothiazide (HYDRODIURIL) 25 MG tablet Take 1 tablet by mouth as needed.    . Multiple Vitamin (MULTIVITAMIN) tablet Take 1 tablet by mouth daily.    . vitamin B-12 (CYANOCOBALAMIN) 100 MCG tablet Take 50 mcg by mouth daily.     No current facility-administered medications for this visit.     Family History  Problem Relation Age of Onset  . Hypertension Mother   . Heart attack Mother 37  . Hypercholesterolemia Father   . Cancer Sister        breast, bone  . Breast cancer Sister   . Diabetes Paternal Grandfather   . Cancer Paternal Grandfather        unknown type  . Heart attack Paternal Grandmother   . Breast cancer Cousin     Review of Systems  Genitourinary:       Night urination   All other systems reviewed and are negative.   Exam:   BP 132/86 (BP Location: Right Arm, Patient Position: Sitting, Cuff Size: Large)   Pulse 64   Resp 16   Ht 5' 4.25" (1.632 m)   Wt 194 lb 12.8 oz (88.4 kg)  BMI 33.18 kg/m    Height: 5' 4.25" (163.2 cm)  Ht Readings from Last 3 Encounters:  03/18/18 5' 4.25" (1.632 m)  06/19/16 5' 4.5" (1.638 m)  02/23/15 5' 4.5" (1.638 m)    General appearance: alert, cooperative and appears stated age Head: Normocephalic, without obvious abnormality, atraumatic Neck: no adenopathy, supple, symmetrical, trachea midline and thyroid normal to inspection and palpation Lungs: clear to auscultation bilaterally Breasts: normal appearance, no masses or tenderness Heart: regular rate and rhythm Abdomen: soft, non-tender; bowel sounds normal; no masses,  no organomegaly Extremities: extremities normal, atraumatic, no cyanosis or edema Skin: Skin color, texture, turgor normal. No rashes or lesions Lymph nodes: Cervical, supraclavicular, and axillary nodes normal. No abnormal inguinal nodes palpated Neurologic: Grossly normal   Pelvic: External genitalia:  no lesions               Urethra:  normal appearing urethra with no masses, tenderness or lesions              Bartholins and Skenes: normal                 Vagina: normal appearing vagina with normal color and discharge, no lesions              Cervix: no lesions              Pap taken: Yes.   Bimanual Exam:  Uterus:  normal size, contour, position, consistency, mobility, non-tender              Adnexa: normal adnexa and no mass, fullness, tenderness               Rectovaginal: Confirms               Anus:  normal sphincter tone, no lesions  Chaperone was present for exam.  A:  Well Woman with normal exam PMP, no HRT Family hx of breast caner, sister diagnosed in late 7's Hyperlipidemia Hypertension OAB  P:   Mammogram guidelines reviewed.  She is going 3D MMGs. pap smear obtained today CBC, Lipids, TSH, Vit D and CMP obtained Trial of myrbetriq 25mg .  #30/2RF.  Recheck BP in 2 weeks Declines shingrix vaccination Colonoscopy is UTD Plan BMD at around 60 return annually or prn

## 2018-03-19 LAB — COMPREHENSIVE METABOLIC PANEL
ALT: 14 IU/L (ref 0–32)
AST: 22 IU/L (ref 0–40)
Albumin/Globulin Ratio: 2 (ref 1.2–2.2)
Albumin: 4.6 g/dL (ref 3.8–4.9)
Alkaline Phosphatase: 95 IU/L (ref 39–117)
BUN/Creatinine Ratio: 20 (ref 9–23)
BUN: 16 mg/dL (ref 6–24)
Bilirubin Total: 0.5 mg/dL (ref 0.0–1.2)
CO2: 24 mmol/L (ref 20–29)
Calcium: 9.4 mg/dL (ref 8.7–10.2)
Chloride: 102 mmol/L (ref 96–106)
Creatinine, Ser: 0.81 mg/dL (ref 0.57–1.00)
GFR calc Af Amer: 93 mL/min/{1.73_m2} (ref >59)
GFR calc non Af Amer: 81 mL/min/{1.73_m2} (ref >59)
GLUCOSE: 88 mg/dL (ref 65–99)
Globulin, Total: 2.3 g/dL (ref 1.5–4.5)
Potassium: 4.6 mmol/L (ref 3.5–5.2)
Sodium: 143 mmol/L (ref 134–144)
Total Protein: 6.9 g/dL (ref 6.0–8.5)

## 2018-03-19 LAB — CBC
Hematocrit: 41.3 % (ref 34.0–46.6)
Hemoglobin: 13.8 g/dL (ref 11.1–15.9)
MCH: 30.1 pg (ref 26.6–33.0)
MCHC: 33.4 g/dL (ref 31.5–35.7)
MCV: 90 fL (ref 79–97)
PLATELETS: 236 10*3/uL (ref 150–450)
RBC: 4.58 x10E6/uL (ref 3.77–5.28)
RDW: 12.4 % (ref 11.7–15.4)
WBC: 4.5 10*3/uL (ref 3.4–10.8)

## 2018-03-19 LAB — LIPID PANEL
Chol/HDL Ratio: 3.4 ratio (ref 0.0–4.4)
Cholesterol, Total: 205 mg/dL — ABNORMAL HIGH (ref 100–199)
HDL: 61 mg/dL (ref >39)
LDL Calculated: 135 mg/dL — ABNORMAL HIGH (ref 0–99)
Triglycerides: 46 mg/dL (ref 0–149)
VLDL CHOLESTEROL CAL: 9 mg/dL (ref 5–40)

## 2018-03-19 LAB — CYTOLOGY - PAP: Diagnosis: NEGATIVE

## 2018-03-19 LAB — VITAMIN D 25 HYDROXY (VIT D DEFICIENCY, FRACTURES): Vit D, 25-Hydroxy: 36.6 ng/mL (ref 30.0–100.0)

## 2018-03-19 LAB — TSH: TSH: 1.79 u[IU]/mL (ref 0.450–4.500)

## 2018-03-31 ENCOUNTER — Telehealth: Payer: Self-pay | Admitting: Obstetrics & Gynecology

## 2018-03-31 NOTE — Telephone Encounter (Signed)
Patient canceled her 2 week bp check 04/01/18. She will call later to reschedule.

## 2018-04-01 ENCOUNTER — Ambulatory Visit: Payer: BLUE CROSS/BLUE SHIELD

## 2018-04-06 ENCOUNTER — Encounter: Payer: Self-pay | Admitting: Obstetrics & Gynecology

## 2018-04-07 ENCOUNTER — Encounter: Payer: Self-pay | Admitting: Obstetrics & Gynecology

## 2018-04-16 ENCOUNTER — Other Ambulatory Visit: Payer: Self-pay

## 2018-04-16 ENCOUNTER — Ambulatory Visit (INDEPENDENT_AMBULATORY_CARE_PROVIDER_SITE_OTHER): Payer: BLUE CROSS/BLUE SHIELD | Admitting: Podiatry

## 2018-04-16 ENCOUNTER — Ambulatory Visit (INDEPENDENT_AMBULATORY_CARE_PROVIDER_SITE_OTHER): Payer: BLUE CROSS/BLUE SHIELD

## 2018-04-16 ENCOUNTER — Encounter: Payer: Self-pay | Admitting: Podiatry

## 2018-04-16 DIAGNOSIS — M2042 Other hammer toe(s) (acquired), left foot: Secondary | ICD-10-CM | POA: Diagnosis not present

## 2018-04-16 DIAGNOSIS — M7672 Peroneal tendinitis, left leg: Secondary | ICD-10-CM | POA: Diagnosis not present

## 2018-04-16 DIAGNOSIS — M2011 Hallux valgus (acquired), right foot: Secondary | ICD-10-CM | POA: Diagnosis not present

## 2018-04-16 MED ORDER — TRIAMCINOLONE ACETONIDE 10 MG/ML IJ SUSP
10.0000 mg | Freq: Once | INTRAMUSCULAR | Status: AC
  Administered 2018-04-16: 10 mg

## 2018-04-16 NOTE — Progress Notes (Signed)
Subjective:   Patient ID: Margaret Webb, female   DOB: 58 y.o.   MRN: [id number]   HPI Patient states that was doing well but then I started to swell again on my left foot and it sensitive in the outside has started to hurt me again and also I would like to get this bunion fixed on my right possibly in April   ROS      Objective:  Physical Exam  Neurovascular status intact with patient's left foot showing moderate localized swelling with negative Homans sign and no pitting edema.  On the lateral side of the foot there is inflammation of the peroneal tendon as it inserts into the base of the fifth metatarsal which may be due to the swelling pattern     Assessment:  Peroneal tendinitis left with bunion deformity right and localized swelling left which may be due to increased activity levels     Plan:  H&P and x-rays reviewed bilateral.  At this point I did do a sterile prep and then injected the sheath of the tendon as it inserted into the base of fifth metatarsal 3 mg Kenalog 5 mg Xylocaine advised on ice therapy anti-inflammatories discussed bunion and we will do the best we can to try to get this taken care of given the environment wearing currently  X-ray indicates that there is moderate structural bunion deformity right and well-healing surgical sites left with second and third metatarsals healing well screws in place with no indications of movement

## 2018-04-16 NOTE — Patient Instructions (Signed)
Pre-Operative Instructions  Congratulations, you have decided to take an important step towards improving your quality of life.  You can be assured that the doctors and staff at [facility] will be with you every step of the way.  Here are some important things you should know:  1. Plan to be at the surgery center/hospital at least 1 (one) hour prior to your scheduled time, unless otherwise directed by the surgical center/hospital staff.  You must have a responsible adult accompany you, remain during the surgery and drive you home.  Make sure you have directions to the surgical center/hospital to ensure you arrive on time. 2. If you are having surgery at [facility] or [facility], you will need a copy of your medical history and physical form from your family physician within one month prior to the date of surgery. We will give you a form for your primary physician to complete.  3. We make every effort to accommodate the date you request for surgery.  However, there are times where surgery dates or times have to be moved.  We will contact you as soon as possible if a change in schedule is required.   4. No aspirin/ibuprofen for one week before surgery.  If you are on aspirin, any non-steroidal anti-inflammatory medications (Mobic, Aleve, Ibuprofen) should not be taken seven (7) days prior to your surgery.  You make take Tylenol for pain prior to surgery.  5. Medications - If you are taking daily heart and blood pressure medications, seizure, reflux, allergy, asthma, anxiety, pain or diabetes medications, make sure you notify the surgery center/hospital before the day of surgery so they can tell you which medications you should take or avoid the day of surgery. 6. No food or drink after midnight the night before surgery unless directed otherwise by surgical center/hospital staff. 7. No alcoholic beverages 24-hours prior to surgery.  No smoking 24-hours prior or 24-hours after  surgery. 8. Wear loose pants or shorts. They should be loose enough to fit over bandages, boots, and casts. 9. Don't wear slip-on shoes. Sneakers are preferred. 10. Bring your boot with you to the surgery center/hospital.  Also bring crutches or a walker if your physician has prescribed it for you.  If you do not have this equipment, it will be provided for you after surgery. 11. If you have not been contacted by the surgery center/hospital by the day before your surgery, call to confirm the date and time of your surgery. 12. Leave-time from work may vary depending on the type of surgery you have.  Appropriate arrangements should be made prior to surgery with your employer. 13. Prescriptions will be provided immediately following surgery by your doctor.  Fill these as soon as possible after surgery and take the medication as directed. Pain medications will not be refilled on weekends and must be approved by the doctor. 14. Remove nail polish on the operative foot and avoid getting pedicures prior to surgery. 15. Wash the night before surgery.  The night before surgery wash the foot and leg well with water and the antibacterial soap provided. Be sure to pay special attention to beneath the toenails and in between the toes.  Wash for at least three (3) minutes. Rinse thoroughly with water and dry well with a towel.  Perform this wash unless told not to do so by your physician.  Enclosed: 1 Ice pack (please put in freezer the night before surgery)   1 Hibiclens skin cleaner     Pre-op instructions  If you have any questions regarding the instructions, please do not hesitate to call our office.  [place]: [address], [place], [place] [address] -- [phone]  [place]: [address] [place], [place] [address] -- [phone]  [place]: [address]  [place], [place] [address] -- [phone]  [place]: [address], [address], [place], [place] [address] -- [phone]  Website:  [email or web address] 

## 2018-05-27 ENCOUNTER — Other Ambulatory Visit: Payer: Self-pay

## 2018-05-27 ENCOUNTER — Ambulatory Visit (INDEPENDENT_AMBULATORY_CARE_PROVIDER_SITE_OTHER): Payer: BLUE CROSS/BLUE SHIELD

## 2018-05-27 ENCOUNTER — Ambulatory Visit (INDEPENDENT_AMBULATORY_CARE_PROVIDER_SITE_OTHER): Payer: BLUE CROSS/BLUE SHIELD | Admitting: Podiatry

## 2018-05-27 VITALS — Temp 97.7°F

## 2018-05-27 DIAGNOSIS — M2042 Other hammer toe(s) (acquired), left foot: Secondary | ICD-10-CM

## 2018-05-27 DIAGNOSIS — M722 Plantar fascial fibromatosis: Secondary | ICD-10-CM | POA: Diagnosis not present

## 2018-05-27 DIAGNOSIS — M2011 Hallux valgus (acquired), right foot: Secondary | ICD-10-CM | POA: Diagnosis not present

## 2018-05-27 NOTE — Progress Notes (Signed)
Subjective:   Patient ID: Margaret Webb, female   DOB: 58 y.o.   MRN: [id number]   HPI Patient states overall doing well with her left foot and wants to get the right bunion fixed and also states the right heel has been bothering her   ROS      Objective:  Physical Exam  Neurovascular status intact with patient's left foot overall doing well with mild edema still noted in the forefoot but minimal discomfort with structural bunion deformity right and pain of the right plantar heel     Assessment:  Overall doing well with patient healing well from forefoot surgery left with still mild swelling which is consistent with the.  Postop with structural bunion deformity right and plantar fasciitis right     Plan:  H&P x-rays left reviewed with patient and today I have recommended continuation of rigid bottom shoes.  For the right we discussed bunion correction she wants it done and she signed consent form and after extensive review she understands all complications and alternative treatments.  I recommended a modified McBride type bunionectomy along with plantar heel injection and at this time she is scheduled for surgery signed a consent form understanding recovery and risk.  Encouraged to call with questions

## 2018-05-27 NOTE — Patient Instructions (Signed)
Pre-Operative Instructions  Congratulations, you have decided to take an important step towards improving your quality of life.  You can be assured that the doctors and staff at [facility] will be with you every step of the way.  Here are some important things you should know:  1. Plan to be at the surgery center/hospital at least 1 (one) hour prior to your scheduled time, unless otherwise directed by the surgical center/hospital staff.  You must have a responsible adult accompany you, remain during the surgery and drive you home.  Make sure you have directions to the surgical center/hospital to ensure you arrive on time. 2. If you are having surgery at [facility] or [facility], you will need a copy of your medical history and physical form from your family physician within one month prior to the date of surgery. We will give you a form for your primary physician to complete.  3. We make every effort to accommodate the date you request for surgery.  However, there are times where surgery dates or times have to be moved.  We will contact you as soon as possible if a change in schedule is required.   4. No aspirin/ibuprofen for one week before surgery.  If you are on aspirin, any non-steroidal anti-inflammatory medications (Mobic, Aleve, Ibuprofen) should not be taken seven (7) days prior to your surgery.  You make take Tylenol for pain prior to surgery.  5. Medications - If you are taking daily heart and blood pressure medications, seizure, reflux, allergy, asthma, anxiety, pain or diabetes medications, make sure you notify the surgery center/hospital before the day of surgery so they can tell you which medications you should take or avoid the day of surgery. 6. No food or drink after midnight the night before surgery unless directed otherwise by surgical center/hospital staff. 7. No alcoholic beverages 24-hours prior to surgery.  No smoking 24-hours prior or 24-hours after  surgery. 8. Wear loose pants or shorts. They should be loose enough to fit over bandages, boots, and casts. 9. Don't wear slip-on shoes. Sneakers are preferred. 10. Bring your boot with you to the surgery center/hospital.  Also bring crutches or a walker if your physician has prescribed it for you.  If you do not have this equipment, it will be provided for you after surgery. 11. If you have not been contacted by the surgery center/hospital by the day before your surgery, call to confirm the date and time of your surgery. 12. Leave-time from work may vary depending on the type of surgery you have.  Appropriate arrangements should be made prior to surgery with your employer. 13. Prescriptions will be provided immediately following surgery by your doctor.  Fill these as soon as possible after surgery and take the medication as directed. Pain medications will not be refilled on weekends and must be approved by the doctor. 14. Remove nail polish on the operative foot and avoid getting pedicures prior to surgery. 15. Wash the night before surgery.  The night before surgery wash the foot and leg well with water and the antibacterial soap provided. Be sure to pay special attention to beneath the toenails and in between the toes.  Wash for at least three (3) minutes. Rinse thoroughly with water and dry well with a towel.  Perform this wash unless told not to do so by your physician.  Enclosed: 1 Ice pack (please put in freezer the night before surgery)   1 Hibiclens skin cleaner     Pre-op instructions  If you have any questions regarding the instructions, please do not hesitate to call our office.  [place]: [address], [place], [place] [address] -- [phone]  [place]: [address] [place], [place] [address] -- [phone]  [place]: [address]  [place], [place] [address] -- [phone]  [place]: [address], [place],  [address] -- [phone]  Website:  [email or web address] 

## 2018-05-28 ENCOUNTER — Ambulatory Visit: Payer: BLUE CROSS/BLUE SHIELD | Admitting: Podiatry

## 2018-06-04 ENCOUNTER — Telehealth: Payer: Self-pay | Admitting: *Deleted

## 2018-06-04 NOTE — Telephone Encounter (Signed)
DOS 06/08/2018, CPT CODE 89483, 47583 - KELLER/MCBRIDE BUNIONECTOMY AND STEROID INJECTION RIGHT FOOT  [employer]: Policy Effective : 07/46/0029 - 84/73/0856  PRE-CERT IS NOT REQUIRED.  In-Network    Max Per Benefit Period Year-to-Date Remaining  CoInsurance  0%   Deductible  $2500.00 $0.00  Out-Of-Pocket  $5000.00 $0.00    AMBULATORY SURGERY  In Network  Copay Coinsurance  Not Applicable  94% per [place]

## 2018-06-08 ENCOUNTER — Encounter: Payer: Self-pay | Admitting: Podiatry

## 2018-06-08 DIAGNOSIS — M722 Plantar fascial fibromatosis: Secondary | ICD-10-CM

## 2018-06-08 DIAGNOSIS — M2011 Hallux valgus (acquired), right foot: Secondary | ICD-10-CM

## 2018-06-08 HISTORY — PX: FOOT SURGERY: SHX648

## 2018-06-09 ENCOUNTER — Encounter: Payer: Self-pay | Admitting: Podiatry

## 2018-06-11 NOTE — Progress Notes (Signed)
Called patient at [phone] (Cell #) to check to see how they were doing from their surgery that was done by Dr. Paulla Dolly on Tuesday, Jun 08, 2018.  DOS 06/08/18 McBride Bunionectomy RT; Inj. RT Heel  Pt stated,"My foot hurts; Pain 6/10; I am taking my pain medicine twice a day. Once in the morning and once at night. I will need to get a refill on medication at my next visit".  "I have no fever, chills, nausea, vomiting, shortness of breath or tightness in the calf."  "I have no bleeding or drainage on my surgical foot. I had a good experience at the [facility]."  Pt had no questions regarding her surgery.  Pt has an appointment with Marylou Mccoy, RN on Wednesday, Jun 16, 2018 at 4:00 pm. Dr. Paulla Dolly will see patient after Janett Billow.

## 2018-06-16 ENCOUNTER — Other Ambulatory Visit: Payer: Self-pay

## 2018-06-16 ENCOUNTER — Ambulatory Visit (INDEPENDENT_AMBULATORY_CARE_PROVIDER_SITE_OTHER): Payer: Self-pay | Admitting: Podiatry

## 2018-06-16 ENCOUNTER — Encounter: Payer: Self-pay | Admitting: Podiatry

## 2018-06-16 ENCOUNTER — Ambulatory Visit (INDEPENDENT_AMBULATORY_CARE_PROVIDER_SITE_OTHER): Payer: BLUE CROSS/BLUE SHIELD

## 2018-06-16 VITALS — Temp 97.5°F

## 2018-06-16 DIAGNOSIS — Z09 Encounter for follow-up examination after completed treatment for conditions other than malignant neoplasm: Secondary | ICD-10-CM

## 2018-06-16 DIAGNOSIS — M2011 Hallux valgus (acquired), right foot: Secondary | ICD-10-CM

## 2018-06-17 NOTE — Progress Notes (Signed)
Subjective:   Patient ID: Margaret Webb, female   DOB: 58 y.o.   MRN: [id number]   HPI Patient presents stating doing fine on her right foot with minimal discomfort states she can wear shoes without pain   ROS      Objective:  Physical Exam  Neurovascular status intact with patient's right foot healing well with wound edges well coapted and good alignment with good range of motion     Assessment:  Doing well post bunionectomy right     Plan:  H&P x-rays reviewed and advised on doing as much walking as she feels comfortable and continue surgical shoe usage for the next several weeks and reapplied sterile dressing.  Patient will be seen back in 2 weeks or earlier if needed  X-rays indicate that there is good resection of bone medial side first metatarsal head right

## 2018-06-30 ENCOUNTER — Encounter: Payer: Self-pay | Admitting: Podiatry

## 2018-06-30 ENCOUNTER — Other Ambulatory Visit: Payer: Self-pay

## 2018-06-30 ENCOUNTER — Ambulatory Visit (INDEPENDENT_AMBULATORY_CARE_PROVIDER_SITE_OTHER): Payer: BLUE CROSS/BLUE SHIELD

## 2018-06-30 ENCOUNTER — Ambulatory Visit (INDEPENDENT_AMBULATORY_CARE_PROVIDER_SITE_OTHER): Payer: BLUE CROSS/BLUE SHIELD | Admitting: Podiatry

## 2018-06-30 VITALS — Temp 97.5°F

## 2018-06-30 DIAGNOSIS — M2011 Hallux valgus (acquired), right foot: Secondary | ICD-10-CM

## 2018-06-30 NOTE — Progress Notes (Signed)
Subjective:   Patient ID: Margaret Webb, female   DOB: 58 y.o.   MRN: [id number]   HPI Patient states he feels pretty well but I do get occasional discomfort if I do too much   ROS      Objective:  Physical Exam  Neurovascular status intact negative Homans sign noted with patient's right foot healing well wound edges well coapted hallux in rectus position good range of motion with no crepitus     Assessment:  Doing well post bunionectomy right     Plan:  H&P x-ray reviewed and gradual return to soft shoe gear recommended with continued range of motion exercises.  Patient is discharged but will be seen back as needed  X-ray indicates satisfactory resection of bone medial aspect first metatarsal head right

## 2018-09-07 DIAGNOSIS — M2242 Chondromalacia patellae, left knee: Secondary | ICD-10-CM | POA: Insufficient documentation

## 2018-09-07 DIAGNOSIS — M25552 Pain in left hip: Secondary | ICD-10-CM | POA: Insufficient documentation

## 2018-09-07 DIAGNOSIS — M25562 Pain in left knee: Secondary | ICD-10-CM | POA: Insufficient documentation

## 2018-09-07 DIAGNOSIS — M7062 Trochanteric bursitis, left hip: Secondary | ICD-10-CM | POA: Insufficient documentation

## 2018-09-08 NOTE — Progress Notes (Signed)
Virtual Visit via Video Note The purpose of this virtual visit is to provide medical care while limiting exposure to the novel coronavirus.    Consent was obtained for video visit:  Yes.   Answered questions that patient had about telehealth interaction:  Yes.   I discussed the limitations, risks, security and privacy concerns of performing an evaluation and management service by telemedicine. I also discussed with the patient that there may be a patient responsible charge related to this service. The patient expressed understanding and agreed to proceed.  Pt location: Home Physician Location: Home Name of referring provider:  Aletha Halim., PA-C I connected with Margaret Webb at patients initiation/request on 09/09/2018 at 11:10 AM EDT by video enabled telemedicine application and verified that I am speaking with the correct person using two identifiers. Pt MRN:  [id number] Pt DOB:  14-Aug-1960 Video Participants:  Margaret Webb   History of Present Illness:  Margaret Webb who presents for headaches.  History supplemented by referring provider notes.  She has had headaches since she was a young woman.  She describes a severe stabbing pain in the right occipital region.  For the past month, it has occurred every night.  Prior to a month ago, they would occur 1 to 2 times a week.  She wakes up in the middle of the night (around 12 AM) with the headache and immediately takes acetaminophen-caffeine.  Headache gradually resolves by 6 or 7 AM.  No associated nausea, vomiting, photophobia, phonophobia, visual disturbance or unilateral numbness or weakness.  No particular trigger.  The acetaminophen-caffeine pills no longer seem as effective.  She has a constant dull bifrontal headache as well.  She has degenerative disease of the cervical spine.  She has had multiple imaging.  Last MRI of cervical spine from 11/26/16 was personally reviewed and demonstrated degeneration at C4-C5 and  C5-C6 causing mild spinal stenosis and moderate foraminal stenosis, as well as mild right foraminal disc bulge at C3-C4.  She was previously treated by Dr. Sherwood Gambler and underwent injections.  She denies radicular pain, numbness or weakness in the upper extremities.  For 10 years, she has had near-constant twitching of the left eye.  For the same amount of time, she also has noted facial asymmetry, specifically mild left lower facial droop.  She says sometimes she drools from the left side of her mouth.  MRI of brain without contrast from 11/05/12 was personally reviewed and demonstrated some premature cerebral and cerebellar atrophy but no acute intracranial abnormalities.  Current NSAIDS:  none Current analgesics:  Excedrin ASA-free Current triptans:  none Current ergotamine:  none Current anti-emetic:  none Current muscle relaxants:  none Current anti-anxiolytic:  none Current sleep aide:  none Current Antihypertensive medications:  none Current Antidepressant medications:  none Current Anticonvulsant medications:  none Current anti-CGRP:  none Current Vitamins/Herbal/Supplements:  none Current Antihistamines/Decongestants:  Flonase Other therapy:  none Hormone/birth control:  none Other medications:  none  Past NSAIDS:  Diclofenac 75mg  tablet, Mobic (back and neck pain) Past analgesics:  none Past abortive triptans:  none Past abortive ergotamine:  none Past muscle relaxants:  Baclofen (back and neck pain), Flexeril Past anti-emetic:  none Past antihypertensive medications: none Past antidepressant medications:  sertralilne 50mg  Past anticonvulsant medications:  Topiramate, gabapentin (back pain) Past anti-CGRP:  none Past vitamins/Herbal/Supplements:  none Past antihistamines/decongestants:  none Other past therapies:  Trigger point injections (possible occipital nerve blocks) which were ineffective.  Past Medical History: Past Medical History:  Diagnosis  Date  . Breast  calcification, right 07/2012  . Dental bridge present    lower  . Dental crowns present   . Headache(784.0)    sinus  . Hypercholesteremia   . Infertility    h/o with second pregnancy  . Seasonal allergies     Medications: Outpatient Encounter Medications as of 09/09/2018  Medication Sig Note  . atorvastatin (LIPITOR) 40 MG tablet Take 40 mg by mouth daily.   . fish oil-omega-3 fatty acids 1000 MG capsule Take 2 g by mouth daily.   . fluticasone (FLONASE) 50 MCG/ACT nasal spray  02/23/2015: Received from: External Pharmacy  . hydrochlorothiazide (HYDRODIURIL) 25 MG tablet Take 1 tablet by mouth as needed. 02/23/2015: Received from: External Pharmacy Received Sig:   . mirabegron ER (MYRBETRIQ) 25 MG TB24 tablet Take 1 tablet (25 mg total) by mouth daily.   . Multiple Vitamin (MULTIVITAMIN) tablet Take 1 tablet by mouth daily.   . vitamin B-12 (CYANOCOBALAMIN) 100 MCG tablet Take 50 mcg by mouth daily.    No facility-administered encounter medications on file as of 09/09/2018.     Allergies: Allergies  Allergen Reactions  . Iodine     swelling  . Oxycodone Nausea Only    Pt stated, "it made my stomach upset"    Family History: Family History  Problem Relation Age of Onset  . Hypertension Mother   . Heart attack Mother 59  . Hypercholesterolemia Father   . Cancer Sister        breast, bone  . Breast cancer Sister   . Diabetes Paternal Grandfather   . Cancer Paternal Grandfather        unknown type  . Heart attack Paternal Grandmother   . Breast cancer Cousin     Social History: Social History   Socioeconomic History  . Marital status: Married    Spouse name: Not on file  . Number of children: Not on file  . Years of education: Not on file  . Highest education level: Not on file  Occupational History  . Not on file  Social Needs  . Financial resource strain: Not on file  . Food insecurity    Worry: Not on file    Inability: Not on file  . Transportation needs     Medical: Not on file    Non-medical: Not on file  Tobacco Use  . Smoking status: Never Smoker  . Smokeless tobacco: Never Used  Substance and Sexual Activity  . Alcohol use: No    Alcohol/week: 1.0 standard drinks    Types: 1 Standard drinks or equivalent per week    Comment: maybe once a month  . Drug use: No  . Sexual activity: Yes    Partners: Male    Birth control/protection: Other-see comments    Comment: vasectomy  Lifestyle  . Physical activity    Days per week: Not on file    Minutes per session: Not on file  . Stress: Not on file  Relationships  . Social Herbalist on phone: Not on file    Gets together: Not on file    Attends religious service: Not on file    Active member of club or organization: Not on file    Attends meetings of clubs or organizations: Not on file    Relationship status: Not on file  . Intimate partner violence    Fear of current or ex partner: Not on file    Emotionally abused: Not on  file    Physically abused: Not on file    Forced sexual activity: Not on file  Other Topics Concern  . Not on file  Social History Narrative  . Not on file    Observations/Objective:   Temperature (!) 97.4 F (36.3 C), temperature source Temporal, height 5\' 5"  (1.651 m), weight 182 lb (82.6 kg). No acute distress.  Alert and oriented.  Speech fluent and not dysarthric.  Language intact.  Eyes orthophoric on primary gaze.  From my observation through the camera, I do not see any significant facial droop.  However, she does exhibit mild ptosis of left eye with twitching of the left eye and face.    Assessment and Plan:   1.  Occipital-cervicogenic headache, not intractable.   2.  Chronic neck pain with degenerative disease of cervical spine. 3.  Left sided hemifacial spasm.  May benefit from Botox.  However, I told her that such treatment may aggravate ptosis.  1.  For headache, we will start nortriptyline 25mg  at bedtime.  If headache not  improved in 4 weeks, she was advised to contact me and we can increase dose.  It may also help with sleep and drooling. 2.  Once she knows she is tolerating the nortriptyline, may take tizanidine 2mg  at bedtime as needed for neck pain. 3.  STOP EXCEDRIN.  For abortive headache therapy, advised to take ibuprofen (up to 800mg ).  However, limit use to no more than 2 days out of week to prevent risk of rebound or medication-overuse headache. 4.  For hemifacial spasm, I will refer her to my colleague, Dr. Wells Guiles Tat, for evaluation and possible treatment. 5.  Start using a NON-CONTOURED memory foam pillow at night.  You want to try and keep your neck in a neutral position. 6.  Follow up with me in 4 months.  Follow Up Instructions:    -I discussed the assessment and treatment plan with the patient. The patient was provided an opportunity to ask questions and all were answered. The patient agreed with the plan and demonstrated an understanding of the instructions.   The patient was advised to call back or seek an in-person evaluation if the symptoms worsen or if the condition fails to improve as anticipated.  Dudley Major, DO

## 2018-09-09 ENCOUNTER — Encounter: Payer: Self-pay | Admitting: Neurology

## 2018-09-09 ENCOUNTER — Telehealth (INDEPENDENT_AMBULATORY_CARE_PROVIDER_SITE_OTHER): Payer: BC Managed Care – PPO | Admitting: Neurology

## 2018-09-09 ENCOUNTER — Other Ambulatory Visit: Payer: Self-pay

## 2018-09-09 VITALS — Temp 97.4°F | Ht 65.0 in | Wt 182.0 lb

## 2018-09-09 DIAGNOSIS — R51 Headache: Secondary | ICD-10-CM | POA: Diagnosis not present

## 2018-09-09 DIAGNOSIS — G5132 Clonic hemifacial spasm, left: Secondary | ICD-10-CM

## 2018-09-09 DIAGNOSIS — M47812 Spondylosis without myelopathy or radiculopathy, cervical region: Secondary | ICD-10-CM

## 2018-09-09 DIAGNOSIS — G4486 Cervicogenic headache: Secondary | ICD-10-CM

## 2018-09-09 MED ORDER — TIZANIDINE HCL 2 MG PO TABS: 2.0000 mg | ORAL_TABLET | Freq: Every evening | ORAL | 3 refills | Status: DC | PRN

## 2018-09-09 MED ORDER — NORTRIPTYLINE HCL 25 MG PO CAPS: 25.0000 mg | ORAL_CAPSULE | Freq: Every day | ORAL | 3 refills | Status: DC

## 2018-09-09 NOTE — Patient Instructions (Addendum)
1.  For headache, we will start nortriptyline 25mg  at bedtime.  If headache not improved in 4 weeks, contact me and we can increase dose. 2.  Once you know you are tolerating the nortriptyline, may take tizanidine 2mg  at bedtime as needed for neck pain. 3.  STOP EXCEDRIN.  When you get a headache, take ibuprofen (up to 800mg ).  However, limit use to no more than 2 days out of week to prevent risk of rebound or medication-overuse headache. 4.  For the eye twitching, you may benefit from Botox.  I will refer you to my colleague, Dr. Wells Guiles Tat, for evaluation and possible treatment. 5.  Start using a NON-CONTOURED memory foam pillow at night.  You want to try and keep your neck in a neutral position. 6.  Follow up with me in 4 months.

## 2018-09-14 ENCOUNTER — Other Ambulatory Visit: Payer: Self-pay | Admitting: Obstetrics & Gynecology

## 2018-09-14 DIAGNOSIS — Z1231 Encounter for screening mammogram for malignant neoplasm of breast: Secondary | ICD-10-CM

## 2018-09-16 ENCOUNTER — Other Ambulatory Visit: Payer: Self-pay

## 2018-09-16 ENCOUNTER — Encounter: Payer: Self-pay | Admitting: Podiatry

## 2018-09-16 ENCOUNTER — Ambulatory Visit (INDEPENDENT_AMBULATORY_CARE_PROVIDER_SITE_OTHER): Payer: BC Managed Care – PPO | Admitting: Podiatry

## 2018-09-16 VITALS — Temp 98.7°F

## 2018-09-16 DIAGNOSIS — M7751 Other enthesopathy of right foot: Secondary | ICD-10-CM | POA: Diagnosis not present

## 2018-09-16 DIAGNOSIS — M779 Enthesopathy, unspecified: Secondary | ICD-10-CM

## 2018-09-17 NOTE — Progress Notes (Signed)
Subjective:   Patient ID: Margaret Webb, female   DOB: 58 y.o.   MRN: [id number]   HPI Patient concerned because she is getting some pain on top of her right foot and feels like it could be the incision or a small tendon   ROS      Objective:  Physical Exam  Neurovascular status intact with patient's right dorsal foot showing some thickness of the incision site around the extensor tendon with a small area of irritation     Assessment:  Possibly low-grade extensor tendinitis versus a slight thickness of the incision site     Plan:  Sterile prep and I did careful dorsal injection 2 mg Dexasone Kenalog 5 mg Xylocaine advised on being careful with shoe gear for the next few days and will be seen back if symptoms persist

## 2018-10-11 ENCOUNTER — Telehealth: Payer: Self-pay | Admitting: *Deleted

## 2018-10-11 NOTE — Telephone Encounter (Signed)
March 5,2020  appointment for blood pressure check was canceled by patient. She declined to reschedule. Per Epic medication list, patient is off Myrbetriq.  Reviewed with Dr Sabra Heck. No additional blood pressure check is needed.   Routing to provider. Encounter closed.

## 2018-10-18 NOTE — Progress Notes (Signed)
Margaret Webb was seen today in neurologic consultation at the request of Pieter Partridge, DO.  The consultation is for the evaluation of botox for hemifacial spasm.  The records that were made available to me were reviewed.  Patient reports that she has had twitching on the left side of the face for about 10 years.  She has noticed she has developed facial asymmetry over the course of time and feels that the L face droops.  It seems to twitch daily, worse on days that she is stressed.  She has never had tx for this.  Does have chronic neck pain and has seen Dr. Rita Ohara for this.  No head tremor.  Does admit to headaches, but states that Dr. Tomi Likens put her on Pamelor.  She initially had a lot of headaches because she was trying to discontinue her Excedrin.  She is doing better but wonders if the Pamelor dose can be increased.  Patient had MRI of the cervical spine on November 26, 2016.  I personally reviewed this.  There was a disc bulge at the C5-C6 level that was stable since 2014.  This did produce central canal stenosis.  There was an MRI of the brain from November 05, 2012.  Radiology reported premature cerebral and cerebellar atrophy, but I was not impressed with this.  I thought it was fairly unremarkable.  PREVIOUS MEDICATIONS: None for hemifacial spasm  ALLERGIES:   Allergies  Allergen Reactions  . Iodine     swelling  . Oxycodone Nausea Only    Pt stated, "it made my stomach upset" and cognitive change    CURRENT MEDICATIONS:  Outpatient Encounter Medications as of 10/21/2018  Medication Sig  . atorvastatin (LIPITOR) 40 MG tablet Take 40 mg by mouth daily.  . fluticasone (FLONASE) 50 MCG/ACT nasal spray   . nortriptyline (PAMELOR) 25 MG capsule Take 1 capsule (25 mg total) by mouth at bedtime.  Marland Kitchen tiZANidine (ZANAFLEX) 2 MG tablet Take 1 tablet (2 mg total) by mouth at bedtime as needed for muscle spasms.  Marland Kitchen zolpidem (AMBIEN) 10 MG tablet Take one pill at night  . [DISCONTINUED]  Acetaminophen-Caffeine (EXCEDRIN ASPIRIN FREE PO) Take by mouth.   No facility-administered encounter medications on file as of 10/21/2018.     PAST MEDICAL HISTORY:   Past Medical History:  Diagnosis Date  . Breast calcification, right 07/2012  . Dental bridge present    lower  . Dental crowns present   . Headache(784.0)    sinus  . Hypercholesteremia   . Infertility    h/o with second pregnancy  . Seasonal allergies     PAST SURGICAL HISTORY:   Past Surgical History:  Procedure Laterality Date  . BREAST BIOPSY Right 08/09/2012   Procedure: BREAST BIOPSY WITH NEEDLE LOCALIZATION;  Surgeon: Haywood Lasso, MD;  Location: [facility];  Service: General;  Laterality: Right;  needle localization at breast center of [place] 10   . CESAREAN SECTION    . CORRECTION HAMMER TOE Left 11/2017   with bunionectomy  . FOOT SURGERY Right 06/08/2018  . HYSTEROSCOPY W/ ENDOMETRIAL ABLATION  11/09/2006    SOCIAL HISTORY:   Social History   Socioeconomic History  . Marital status: Married    Spouse name: Not on file  . Number of children: 1  . Years of education: 67  . Highest education level: 12th grade  Occupational History    Employer: [employer]  Social Needs  . Financial resource strain: Not  on file  . Food insecurity    Worry: Not on file    Inability: Not on file  . Transportation needs    Medical: Not on file    Non-medical: Not on file  Tobacco Use  . Smoking status: Never Smoker  . Smokeless tobacco: Never Used  Substance and Sexual Activity  . Alcohol use: Yes    Alcohol/week: 1.0 standard drinks    Types: 1 Standard drinks or equivalent per week    Comment: maybe once a month  . Drug use: No  . Sexual activity: Yes    Partners: Male    Birth control/protection: Other-see comments    Comment: vasectomy  Lifestyle  . Physical activity    Days per week: Not on file    Minutes per session: Not on file  . Stress: Not on file  Relationships   . Social Herbalist on phone: Not on file    Gets together: Not on file    Attends religious service: Not on file    Active member of club or organization: Not on file    Attends meetings of clubs or organizations: Not on file    Relationship status: Not on file  . Intimate partner violence    Fear of current or ex partner: Not on file    Emotionally abused: Not on file    Physically abused: Not on file    Forced sexual activity: Not on file  Other Topics Concern  . Not on file  Social History Narrative   Patient is right-handed. She lives with her husband in a one level home. She drinks one cup of coffee a day, and 2 glasses of tea a week, other times water. She exercises 3-4 x a week.     FAMILY HISTORY:   Family Status  Relation Name Status  . Mother  Deceased  . Father  Deceased  . Sister x2 (Not Specified)  . PGF  (Not Specified)  . PGM  (Not Specified)  . Cousin  (Not Specified)  . Brother x3 Alive  . Daughter  Alive    ROS:  Review of Systems  Constitutional: Negative.   HENT: Negative.   Eyes: Negative.   Respiratory: Negative.   Cardiovascular: Negative.   Gastrointestinal: Negative.   Genitourinary: Positive for frequency.  Musculoskeletal: Positive for neck pain.  Skin: Negative.   Endo/Heme/Allergies: Negative.     PHYSICAL EXAMINATION:    VITALS:   Vitals:   10/21/18 0829  BP: 119/83  Pulse: 84  Weight: 195 lb 12.8 oz (88.8 kg)  Height: 5\' 5"  (1.651 m)    GEN:  Normal appears female in no acute distress.  Appears stated age. HEENT:  Normocephalic, atraumatic. The mucous membranes are moist. The superficial temporal arteries are without ropiness or tenderness. Cardiovascular: Regular rate and rhythm. Lungs: Clear to auscultation bilaterally. Neck/Heme: There are no carotid bruits noted bilaterally.  NEUROLOGICAL: Orientation:  The patient is alert and oriented x 3.  Fund of knowledge is appropriate.  Recent and remote memory intact.   Attention span and concentration normal.  Repeats and names without difficulty. Cranial nerves: There is decreased size of the left palpebral fissure compared to that of the right.  There is just mild facial asymmetry on the left compared to that of the right, but the nasolabial folds are symmetric when she smiles.  Forehead wrinkle is symmetric.  She is able to hold air in both sides of the cheek.  She is able to squeeze the eyes tightly shut and I am unable to open them when she does this.   Extraocular muscles are intact and visual fields are full to confrontational testing. Speech is fluent and clear. Soft palate rises symmetrically and there is no tongue deviation. Hearing is intact to conversational tone. Tone: Tone is good throughout. Sensation: Sensation is intact to light touch and pinprick throughout (facial, trunk, extremities). Vibration is intact at the bilateral big toe. There is no extinction with double simultaneous stimulation. There is no sensory dermatomal level identified. Coordination:  The patient has no difficulty with RAM's or FNF bilaterally. Motor: Strength is 5/5 in the bilateral upper and lower extremities.  Shoulder shrug is equal and symmetric. There is no pronator drift.  There are no fasciculations noted. DTR's: Deep tendon reflexes are 2/4 at the bilateral biceps, triceps, brachioradialis, patella and achilles.  Plantar responses are downgoing bilaterally. Gait and Station: The patient is able to ambulate without difficulty. The patient is able to heel toe walk without any difficulty. The patient is able to ambulate in a tandem fashion. The patient is able to stand in the Romberg position. Abnormal movements: There is spasm around the left orbicularis oculi.   Chemistry      Component Value Date/Time   NA 143 03/18/2018 0906   K 4.6 03/18/2018 0906   CL 102 03/18/2018 0906   CO2 24 03/18/2018 0906   BUN 16 03/18/2018 0906   CREATININE 0.81 03/18/2018 0906    CREATININE 0.80 11/19/2012 1131      Component Value Date/Time   CALCIUM 9.4 03/18/2018 0906   ALKPHOS 95 03/18/2018 0906   AST 22 03/18/2018 0906   ALT 14 03/18/2018 0906   BILITOT 0.5 03/18/2018 0906     Lab Results  Component Value Date   TSH 1.790 03/18/2018      IMPRESSION/PLAN  1.  Left hemifacial spasm  -Discussed nature and pathophysiology.  She does have some mild facial asymmetry from this.  Discussed that Botox generally is the only treatment that is efficacious for this.  Discussed extensively risk, benefits, and side effects, including the fact that this could worsen her facial asymmetry.  She expressed understanding.  She would like to proceed.  We will try to get insurance authorization.  -I will go ahead and repeat her MRI of the brain, since that one was not done with contrast.  She was agreeable.  2.  Headache  -Doing better with Pamelor.  Asks me if she could increase that dose somewhat.  Told her to email Dr. Tomi Likens or call the office.  I will send him a copy of this note as well.  -Patient asks if the Botox will help her headaches.  I told her that I do not think so, unless we tried it in a different pattern.  That is something that we can certainly consider, but I would like to try the standard pattern for hemifacial spasm and see how she does first.  3.  Neck pain  -Based on her picture in epic, I thought perhaps she had some degree of cervical dystonia.  However I really did not see that on her examination today.  I will continue to follow with that.  4.  Much greater than 50% of this visit was spent in counseling and coordinating care.  Total face to face time:  40 min     Cc:  Aletha Halim., PA-C

## 2018-10-21 ENCOUNTER — Other Ambulatory Visit: Payer: Self-pay

## 2018-10-21 ENCOUNTER — Ambulatory Visit (INDEPENDENT_AMBULATORY_CARE_PROVIDER_SITE_OTHER): Payer: BC Managed Care – PPO | Admitting: Neurology

## 2018-10-21 ENCOUNTER — Encounter: Payer: Self-pay | Admitting: Neurology

## 2018-10-21 VITALS — BP 119/83 | HR 84 | Ht 65.0 in | Wt 195.8 lb

## 2018-10-21 DIAGNOSIS — G5132 Clonic hemifacial spasm, left: Secondary | ICD-10-CM

## 2018-10-21 DIAGNOSIS — R51 Headache: Secondary | ICD-10-CM

## 2018-10-21 DIAGNOSIS — M47812 Spondylosis without myelopathy or radiculopathy, cervical region: Secondary | ICD-10-CM

## 2018-10-21 DIAGNOSIS — G4486 Cervicogenic headache: Secondary | ICD-10-CM

## 2018-10-21 NOTE — Patient Instructions (Signed)
1. A referral to [facility] has been placed for your MRI someone will contact you directly to schedule your appt. They are located at [place]. Please contact them directly by calling [phone] with any questions regarding your referral.

## 2018-10-22 DIAGNOSIS — M25561 Pain in right knee: Secondary | ICD-10-CM | POA: Insufficient documentation

## 2018-10-22 NOTE — Progress Notes (Signed)
Received fax from [facility] on patient Botox  Approved 10/21/18 through 10/21/2019 J0585  Ref# [id number]  [phone]

## 2018-10-27 ENCOUNTER — Other Ambulatory Visit: Payer: Self-pay | Admitting: Neurology

## 2018-10-27 MED ORDER — NORTRIPTYLINE HCL 50 MG PO CAPS: 50.0000 mg | ORAL_CAPSULE | Freq: Every day | ORAL | 3 refills | Status: DC

## 2018-10-28 ENCOUNTER — Other Ambulatory Visit: Payer: Self-pay

## 2018-10-28 ENCOUNTER — Ambulatory Visit
Admission: RE | Admit: 2018-10-28 | Discharge: 2018-10-28 | Disposition: A | Discharge status: Home / Self Care | Payer: BC Managed Care – PPO | Source: Ambulatory Visit | Attending: Obstetrics & Gynecology | Admitting: Obstetrics & Gynecology

## 2018-10-28 DIAGNOSIS — Z1231 Encounter for screening mammogram for malignant neoplasm of breast: Secondary | ICD-10-CM

## 2018-10-29 ENCOUNTER — Other Ambulatory Visit: Payer: Self-pay | Admitting: Obstetrics & Gynecology

## 2018-10-29 ENCOUNTER — Ambulatory Visit: Payer: BC Managed Care – PPO | Admitting: Podiatry

## 2018-10-29 DIAGNOSIS — R928 Other abnormal and inconclusive findings on diagnostic imaging of breast: Secondary | ICD-10-CM

## 2018-11-01 ENCOUNTER — Other Ambulatory Visit: Payer: Self-pay

## 2018-11-01 ENCOUNTER — Ambulatory Visit
Admission: RE | Admit: 2018-11-01 | Discharge: 2018-11-01 | Disposition: A | Discharge status: Home / Self Care | Payer: BC Managed Care – PPO | Source: Ambulatory Visit | Attending: Obstetrics & Gynecology | Admitting: Obstetrics & Gynecology

## 2018-11-01 DIAGNOSIS — R928 Other abnormal and inconclusive findings on diagnostic imaging of breast: Secondary | ICD-10-CM

## 2018-11-05 ENCOUNTER — Other Ambulatory Visit: Payer: Self-pay

## 2018-11-05 ENCOUNTER — Ambulatory Visit (INDEPENDENT_AMBULATORY_CARE_PROVIDER_SITE_OTHER): Payer: BC Managed Care – PPO | Admitting: Neurology

## 2018-11-05 DIAGNOSIS — G5132 Clonic hemifacial spasm, left: Secondary | ICD-10-CM | POA: Diagnosis not present

## 2018-11-05 MED ORDER — ONABOTULINUMTOXINA 100 UNITS IJ SOLR
20.0000 [IU] | Freq: Once | INTRAMUSCULAR | Status: AC
  Administered 2018-11-05: 20 [IU] via INTRAMUSCULAR

## 2018-11-05 NOTE — Procedures (Signed)
[facility]   History:  Diagnosis: Hemifacial spasm   Initial side: left  Result History  Onset of effect: n/a  Duration of Benefit: n/a  Adverse Effects: n/a   Consent obtained from: The patient Benefits discussed included, but were not limited to decreased muscle tightness, increased joint range of motion, and decreased pain.  Risk discussed included, but were not limited pain and discomfort, bleeding, bruising, excessive weakness, venous thrombosis, muscle atrophy and dysphagia.  A copy of the patient medication guide was given to the patient which explains the blackbox warning.  Patients identity and treatment sites confirmed Yes.  .  Details of Procedure: Skin was cleaned with alcohol.  A 30 gauge, 1/2 inch needle was introduced to the target muscle.  Prior to injection, the needle plunger was aspirated to make sure the needle was not within a blood vessel.  There was no blood retrieved on aspiration.    Following is a summary of the muscles injected  And the amount of Botulinum toxin used:  Injections  Location Left  Right Units Number of sites        Corrugator      Frontalis      Lower Lid, Lateral 2.5  2.5 1  Lower Lid Medial 2.5  2.5 1  Upper Lid, Lateral 2.5  2.5 1  Upper Lid, Medial      Canthus 5.0  5.0 1  Temporalis      Nasalis 2.5  2.5 1  Procerus      Zygomaticus Major 2.5/2.5  5.0 2  TOTAL UNITS:   20    Agent: Botulinum Type A ( Onobotulinum Toxin type A ).  1 vials of Botox were used, each containing 50 units and freshly diluted with 2 mL of sterile, non-perserved saline   Total injected (Units): 20  Total wasted (Units): 65 Pt tolerated procedure well without complications.   Reinjection is anticipated in 3 months.

## 2018-11-13 ENCOUNTER — Ambulatory Visit
Admission: RE | Admit: 2018-11-13 | Discharge: 2018-11-13 | Disposition: A | Discharge status: Home / Self Care | Payer: BC Managed Care – PPO | Source: Ambulatory Visit | Attending: Neurology | Admitting: Neurology

## 2018-11-13 ENCOUNTER — Other Ambulatory Visit: Payer: Self-pay

## 2018-11-13 DIAGNOSIS — G5132 Clonic hemifacial spasm, left: Secondary | ICD-10-CM

## 2018-11-13 MED ORDER — GADOBENATE DIMEGLUMINE 529 MG/ML IV SOLN
18.0000 mL | Freq: Once | INTRAVENOUS | Status: AC | PRN
  Administered 2018-11-13: 18 mL via INTRAVENOUS

## 2018-11-16 ENCOUNTER — Telehealth: Payer: Self-pay | Admitting: *Deleted

## 2018-11-16 DIAGNOSIS — G5132 Clonic hemifacial spasm, left: Secondary | ICD-10-CM

## 2018-11-16 NOTE — Telephone Encounter (Signed)
Called patient and informed her of Dr. Doristine Devoid result note - See 10/20 0831 phone note. Verbalized understanding and is agreeable to the MRA.   Order entered. Call placed to Margaret Webb and spoke to [facility] - she will call patient now and schedule. They are booked until December but stated that she can call daily to see if any cancellations that day and they will inform patient of that.

## 2018-11-16 NOTE — Telephone Encounter (Signed)
Patient was returning your call

## 2018-11-16 NOTE — Telephone Encounter (Signed)
-----   Message from Daniels, DO sent at 11/15/2018  7:24 AM EDT ----- Let pt know that generally MRI looked okay but there may be some tortuous/winding vessels near that nerve which are irritating it.  To get a better look at that to see if that is the issue, the radiologist is recommending that we send her back for MRA of the brain.  If patient is agreeable, please order MRA brain with attention to the posterior fossa.  Dx: hemifacial spasm.

## 2018-11-16 NOTE — Telephone Encounter (Signed)
Left message for patient to call back regarding results.

## 2018-11-18 ENCOUNTER — Other Ambulatory Visit: Payer: Self-pay

## 2018-11-18 ENCOUNTER — Ambulatory Visit
Admission: RE | Admit: 2018-11-18 | Discharge: 2018-11-18 | Disposition: A | Discharge status: Home / Self Care | Payer: BC Managed Care – PPO | Source: Ambulatory Visit | Attending: Neurology | Admitting: Neurology

## 2018-11-18 ENCOUNTER — Telehealth: Payer: Self-pay | Admitting: Neurology

## 2018-11-18 DIAGNOSIS — R200 Anesthesia of skin: Secondary | ICD-10-CM

## 2018-11-18 DIAGNOSIS — G5132 Clonic hemifacial spasm, left: Secondary | ICD-10-CM

## 2018-11-18 DIAGNOSIS — R202 Paresthesia of skin: Secondary | ICD-10-CM

## 2018-11-18 NOTE — Telephone Encounter (Signed)
Sent referral to Dr. Juliet Rude with Interventional radiology to [id number]

## 2018-11-18 NOTE — Telephone Encounter (Signed)
Spoke with patient and gave her the results of the MRA.  Told her that clinically may be the cause of the hemifacial spasm but don't think that there is likely much more to do.  However, would like to get a c/s with Dr. Juliet Rude with interventional radiology to get an opinion.  Pt was agreeable.  Alyse Low, would you send a referral please.  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

## 2018-11-19 ENCOUNTER — Ambulatory Visit: Payer: BC Managed Care – PPO | Admitting: Podiatry

## 2018-11-23 ENCOUNTER — Telehealth: Payer: Self-pay

## 2018-11-23 ENCOUNTER — Other Ambulatory Visit (HOSPITAL_COMMUNITY): Payer: Self-pay | Admitting: Interventional Radiology

## 2018-11-23 ENCOUNTER — Other Ambulatory Visit: Payer: Self-pay

## 2018-11-23 ENCOUNTER — Telehealth (HOSPITAL_COMMUNITY): Payer: Self-pay

## 2018-11-23 DIAGNOSIS — G5132 Clonic hemifacial spasm, left: Secondary | ICD-10-CM

## 2018-11-23 DIAGNOSIS — I729 Aneurysm of unspecified site: Secondary | ICD-10-CM

## 2018-11-23 NOTE — Telephone Encounter (Signed)
Called to schedule angiogram. Pt will speak with her partner and call back to schedule hopefully for this week. AW

## 2018-11-23 NOTE — Telephone Encounter (Signed)
I left message with Anderson Malta Dr.[facility] office to call back here to confirm whether order was received.[id number]

## 2018-11-23 NOTE — Telephone Encounter (Signed)
Hospital called and they are going to do an angiogram, FYI, and they will contact pt

## 2018-11-25 ENCOUNTER — Telehealth: Payer: Self-pay | Admitting: Student

## 2018-11-25 NOTE — Telephone Encounter (Signed)
Patient scheduled for diagnostic angiogram 12/03/2018 with Dr. Estanislado Pandy.  She does have a contrast allergy and is in need of premedication.  Prescription for premedication called into patient's preferred pharmacy [facility] [address].  Spoke with patient who is aware of premedication.  Instructions given.  Patient voices understanding.  Brynda Greathouse, MS RD PA-C 1:06 PM

## 2018-11-26 ENCOUNTER — Ambulatory Visit: Payer: BC Managed Care – PPO | Admitting: Podiatry

## 2018-12-01 ENCOUNTER — Other Ambulatory Visit: Payer: Self-pay | Admitting: Radiology

## 2018-12-03 ENCOUNTER — Ambulatory Visit (HOSPITAL_COMMUNITY)
Admission: RE | Admit: 2018-12-03 | Discharge: 2018-12-03 | Disposition: A | Discharge status: Home / Self Care | Payer: BLUE CROSS/BLUE SHIELD | Source: Ambulatory Visit | Attending: Interventional Radiology | Admitting: Interventional Radiology

## 2018-12-03 ENCOUNTER — Other Ambulatory Visit: Payer: Self-pay

## 2018-12-03 ENCOUNTER — Other Ambulatory Visit (HOSPITAL_COMMUNITY): Payer: Self-pay | Admitting: Interventional Radiology

## 2018-12-03 DIAGNOSIS — I729 Aneurysm of unspecified site: Secondary | ICD-10-CM

## 2018-12-03 DIAGNOSIS — E7800 Pure hypercholesterolemia, unspecified: Secondary | ICD-10-CM | POA: Insufficient documentation

## 2018-12-03 DIAGNOSIS — Z79899 Other long term (current) drug therapy: Secondary | ICD-10-CM | POA: Insufficient documentation

## 2018-12-03 DIAGNOSIS — I671 Cerebral aneurysm, nonruptured: Secondary | ICD-10-CM | POA: Diagnosis not present

## 2018-12-03 HISTORY — PX: IR 3D INDEPENDENT WKST: IMG2385

## 2018-12-03 HISTORY — PX: IR US GUIDE VASC ACCESS RIGHT: IMG2390

## 2018-12-03 HISTORY — PX: IR ANGIO INTRA EXTRACRAN SEL COM CAROTID INNOMINATE BILAT MOD SED: IMG5360

## 2018-12-03 HISTORY — PX: IR ANGIO VERTEBRAL SEL SUBCLAVIAN INNOMINATE UNI L MOD SED: IMG5364

## 2018-12-03 HISTORY — PX: IR ANGIO VERTEBRAL SEL VERTEBRAL UNI R MOD SED: IMG5368

## 2018-12-03 LAB — BASIC METABOLIC PANEL
Anion gap: 12 (ref 5–15)
BUN: 20 mg/dL (ref 6–20)
CO2: 24 mmol/L (ref 22–32)
Calcium: 9.7 mg/dL (ref 8.9–10.3)
Chloride: 102 mmol/L (ref 98–111)
Creatinine, Ser: 0.91 mg/dL (ref 0.44–1.00)
GFR calc Af Amer: >60 mL/min (ref >60)
GFR calc non Af Amer: >60 mL/min (ref >60)
Glucose, Bld: 141 mg/dL — ABNORMAL HIGH (ref 70–99)
Potassium: 4 mmol/L (ref 3.5–5.1)
Sodium: 138 mmol/L (ref 135–145)

## 2018-12-03 LAB — CBC
HCT: 45.8 % (ref 36.0–46.0)
Hemoglobin: 15 g/dL (ref 12.0–15.0)
MCH: 30.9 pg (ref 26.0–34.0)
MCHC: 32.8 g/dL (ref 30.0–36.0)
MCV: 94.2 fL (ref 80.0–100.0)
Platelets: 276 10*3/uL (ref 150–400)
RBC: 4.86 MIL/uL (ref 3.87–5.11)
RDW: 11.9 % (ref 11.5–15.5)
WBC: 5.8 10*3/uL (ref 4.0–10.5)
nRBC: 0 % (ref 0.0–0.2)

## 2018-12-03 LAB — PROTIME-INR
INR: 1 (ref 0.8–1.2)
Prothrombin Time: 12.9 seconds (ref 11.4–15.2)

## 2018-12-03 MED ORDER — ONDANSETRON HCL 4 MG/2ML IJ SOLN
INTRAMUSCULAR | Status: AC
  Filled 2018-12-03: qty 2

## 2018-12-03 MED ORDER — MIDAZOLAM HCL 2 MG/2ML IJ SOLN
INTRAMUSCULAR | Status: AC | PRN
  Administered 2018-12-03: 1 mg via INTRAVENOUS

## 2018-12-03 MED ORDER — NITROGLYCERIN 1 MG/10 ML FOR IR/CATH LAB
INTRA_ARTERIAL | Status: AC
  Filled 2018-12-03: qty 10

## 2018-12-03 MED ORDER — FENTANYL CITRATE (PF) 100 MCG/2ML IJ SOLN
INTRAMUSCULAR | Status: AC | PRN
  Administered 2018-12-03 (×2): 25 ug via INTRAVENOUS

## 2018-12-03 MED ORDER — SODIUM CHLORIDE 0.9 % IV SOLN
Freq: Once | INTRAVENOUS | Status: AC
  Administered 2018-12-03: 09:00:00 via INTRAVENOUS

## 2018-12-03 MED ORDER — IOHEXOL 300 MG/ML  SOLN
100.0000 mL | Freq: Once | INTRAMUSCULAR | Status: AC | PRN
  Administered 2018-12-03: 12:00:00 21 mL via INTRA_ARTERIAL

## 2018-12-03 MED ORDER — HEPARIN SODIUM (PORCINE) 1000 UNIT/ML IJ SOLN
INTRAMUSCULAR | Status: AC
  Filled 2018-12-03: qty 1

## 2018-12-03 MED ORDER — SODIUM CHLORIDE 0.9 % IV BOLUS
INTRAVENOUS | Status: AC | PRN
  Administered 2018-12-03: 500 mL via INTRAVENOUS

## 2018-12-03 MED ORDER — SODIUM CHLORIDE 0.9 % IV SOLN: INTRAVENOUS | Status: AC

## 2018-12-03 MED ORDER — MIDAZOLAM HCL 2 MG/2ML IJ SOLN
INTRAMUSCULAR | Status: AC
  Filled 2018-12-03: qty 2

## 2018-12-03 MED ORDER — IOHEXOL 300 MG/ML  SOLN
150.0000 mL | Freq: Once | INTRAMUSCULAR | Status: AC | PRN
  Administered 2018-12-03: 12:00:00 75 mL via INTRA_ARTERIAL

## 2018-12-03 MED ORDER — ONDANSETRON HCL 4 MG/2ML IJ SOLN
INTRAMUSCULAR | Status: AC | PRN
  Administered 2018-12-03: 4 mg via INTRAVENOUS

## 2018-12-03 MED ORDER — LIDOCAINE HCL 1 % IJ SOLN
INTRAMUSCULAR | Status: AC
  Filled 2018-12-03: qty 20

## 2018-12-03 MED ORDER — FENTANYL CITRATE (PF) 100 MCG/2ML IJ SOLN
INTRAMUSCULAR | Status: AC
  Filled 2018-12-03: qty 2

## 2018-12-03 MED ORDER — VERAPAMIL HCL 2.5 MG/ML IV SOLN
INTRAVENOUS | Status: AC
  Filled 2018-12-03: qty 2

## 2018-12-03 MED ORDER — NITROGLYCERIN 0.4 MG SL SUBL
SUBLINGUAL_TABLET | SUBLINGUAL | Status: AC
  Filled 2018-12-03: qty 1

## 2018-12-03 NOTE — H&P (Signed)
Chief Complaint: Patient was seen in consultation today for left ICA cavernous segment aneurysm/diagnostic cerebral arteriogram.  Referring Physician(s): Tat, Eustace Quail  Supervising Physician: Luanne Bras  Patient Status: [facility] - Out-pt  History of Present Illness: Margaret Webb is a 58 y.o. female with a past medical history of hypercholesteremia, headaches, and left hemifacial spasm. She has had headaches for years and was seen by Dr. Tomi Likens who recently started her on Pamelor for management. She was then referred to Dr. Carles Collet by Dr. Tomi Likens for management of left hemifacial spasm, who ordered MRI/MRA brain/head for further evaluation.  MRI brain 11/13/2018: 1. Tortuous left vertebral artery, passing close to the root entry zone of the left seventh nerve which could be significant. Additionally, there may be a small arterial branch anteriorly within the internal auditory canal that could also be relevant to the clinical presentation. If desired, one could consider posterior fossa detailed MR angiography. 2. Brain otherwise appears normal.  MRA head 11/18/2018: 1. Tortuous left vertebral artery again seen in close proximity to the root entry zone of the left seventh nerve. The left AICA also loops through the left internal auditory canal. 2. 1.6 mm outpouching from the left cavernous ICA consistent with meningeal branch aneurysm or infundibulum. This finding is extradural.  NIR consulted by Dr. Carles Collet for possible image-guided diagnostic cerebral arteriogram to evaluate left ICA cavernous segment outpouching (that is concerning for aneurysm). Patient awake and alert sitting in bed. Complains of headaches, stable at this time. Denies fever, chills, chest pain, dyspnea, or abdominal pain.   Past Medical History:  Diagnosis Date   Breast calcification, right 07/2012   Dental bridge present    lower   Dental crowns present    Headache(784.0)    sinus   Hypercholesteremia     Infertility    h/o with second pregnancy   Seasonal allergies     Past Surgical History:  Procedure Laterality Date   BREAST BIOPSY Right 08/09/2012   Procedure: BREAST BIOPSY WITH NEEDLE LOCALIZATION;  Surgeon: Haywood Lasso, MD;  Location: [place];  Service: General;  Laterality: Right;  needle localization at breast center of [place] TOE Left 11/2017   with bunionectomy   FOOT SURGERY Right 06/08/2018   HYSTEROSCOPY W/ ENDOMETRIAL ABLATION  11/09/2006    Allergies: Iodine and Oxycodone  Medications: Prior to Admission medications   Medication Sig Start Date End Date Taking? Authorizing Provider  atorvastatin (LIPITOR) 40 MG tablet Take 40 mg by mouth daily. 03/03/18  Yes [provider]  fluticasone (FLONASE) 50 MCG/ACT nasal spray Place 1 spray into both nostrils daily as needed for allergies.  02/05/15  Yes [provider]  nortriptyline (PAMELOR) 50 MG capsule Take 1 capsule (50 mg total) by mouth at bedtime. 10/27/18  Yes Jaffe, Adam R, DO  zolpidem (AMBIEN) 10 MG tablet Take 10 mg by mouth at bedtime as needed for sleep.  10/13/18  Yes [provider]  tiZANidine (ZANAFLEX) 2 MG tablet Take 1 tablet (2 mg total) by mouth at bedtime as needed for muscle spasms. 09/09/18   Pieter Partridge, DO     Family History  Problem Relation Age of Onset   Hypertension Mother    Heart attack Mother 62   Hypercholesterolemia Father    Cancer Sister        breast, bone   Breast cancer Sister    Diabetes Paternal Grandfather  Cancer Paternal Grandfather        unknown type   Heart attack Paternal Grandmother    Breast cancer Cousin    Healthy Brother    Heart attack Brother    Healthy Daughter     Social History   Socioeconomic History   Marital status: Married    Spouse name: Not on file   Number of children: 1   Years of education: 12   Highest education level: 12th  grade  Occupational History    Employer: Copywriter, advertising  Social Designer, fashion/clothing strain: Not on file   Food insecurity    Worry: Not on file    Inability: Not on file   Transportation needs    Medical: Not on file    Non-medical: Not on file  Tobacco Use   Smoking status: Never Smoker   Smokeless tobacco: Never Used  Substance and Sexual Activity   Alcohol use: Yes    Alcohol/week: 1.0 standard drinks    Types: 1 Standard drinks or equivalent per week    Comment: maybe once a month   Drug use: No   Sexual activity: Yes    Partners: Male    Birth control/protection: Other-see comments    Comment: vasectomy  Lifestyle   Physical activity    Days per week: Not on file    Minutes per session: Not on file   Stress: Not on file  Relationships   Social connections    Talks on phone: Not on file    Gets together: Not on file    Attends religious service: Not on file    Active member of club or organization: Not on file    Attends meetings of clubs or organizations: Not on file    Relationship status: Not on file  Other Topics Concern   Not on file  Social History Narrative   Patient is right-handed. She lives with her husband in a one level home. She drinks one cup of coffee a day, and 2 glasses of tea a week, other times water. She exercises 3-4 x a week.      Review of Systems: A 12 point ROS discussed and pertinent positives are indicated in the HPI above.  All other systems are negative.  Review of Systems  Constitutional: Negative for chills and fever.  Respiratory: Negative for shortness of breath and wheezing.   Cardiovascular: Negative for chest pain and palpitations.  Gastrointestinal: Negative for abdominal pain.  Neurological: Positive for headaches.  Psychiatric/Behavioral: Negative for behavioral problems and confusion.    Vital Signs: BP (!) 141/105    Pulse (!) 111    Temp 98 F (36.7 C) (Skin)    Resp 16    Ht 5\' 5"  (1.651 m)     Wt 190 lb (86.2 kg)    SpO2 99%    BMI 31.62 kg/m   Physical Exam Vitals signs and nursing note reviewed.  Constitutional:      General: She is not in acute distress.    Appearance: Normal appearance.  Cardiovascular:     Rate and Rhythm: Normal rate and regular rhythm.     Heart sounds: Normal heart sounds. No murmur.  Pulmonary:     Effort: Pulmonary effort is normal. No respiratory distress.     Breath sounds: Normal breath sounds. No wheezing.  Skin:    General: Skin is warm and dry.  Neurological:     Mental Status: She is alert and oriented to  person, place, and time.  Psychiatric:        Mood and Affect: Mood normal.        Behavior: Behavior normal.        Thought Content: Thought content normal.        Judgment: Judgment normal.      MD Evaluation Airway: WNL Heart: WNL Abdomen: WNL Chest/ Lungs: WNL ASA  Classification: 2 Mallampati/Airway Score: Two   Imaging: Mr Angio Head Wo Contrast  Result Date: 11/18/2018 CLINICAL DATA:  Hemi facial spasm on the left.  Headache for 3 years EXAM: MRA HEAD WITHOUT CONTRAST TECHNIQUE: Angiographic images of the Circle of Willis were obtained using MRA technique without intravenous contrast. COMPARISON:  Brain MRI with without contrast from 5 days ago FINDINGS: As noted on prior brain MRI there is tortuosity of the vertebral and basilar arteries with the left vertebral artery passing just inferior to the root entry zone of the seventh cranial nerve. The left AICA also loops through the IAC on the left. There is a serpiginous appearance of vessels within the lower fourth ventricle but no venous enhancement is seen and there is no aneurysm or definite dilatation. This is likely due to strong dominance of the left vertebral artery. 1.6 mm lateral outpouching from the left cavernous ICA. No branch occlusion or beading in the anterior circulation. IMPRESSION: 1. Tortuous left vertebral artery again seen in close proximity to the root  entry zone of the left seventh nerve. The left AICA also loops through the left internal auditory canal. 2. 1.6 mm outpouching from the left cavernous ICA consistent with meningeal branch aneurysm or infundibulum. This finding is extradural. Electronically Signed   By: Monte Fantasia M.D.   On: 11/18/2018 09:16   Mr Jeri Cos [id number] Contrast  Result Date: 11/13/2018 CLINICAL DATA:  Left hemi facial spasm. Headaches for the last 3 years. EXAM: MRI HEAD WITHOUT AND WITH CONTRAST TECHNIQUE: Multiplanar, multiecho pulse sequences of the brain and surrounding structures were obtained without and with intravenous contrast. CONTRAST:  27mL MULTIHANCE GADOBENATE DIMEGLUMINE 529 MG/ML IV SOLN COMPARISON:  11/05/2012 FINDINGS: Brain: Diffusion imaging does not show any acute or subacute infarction. The brainstem and cerebellum are normal. Cerebral hemispheres are normal without evidence of accelerated atrophy, small or large vessel infarction, mass lesion, hemorrhage, hydrocephalus or extra-axial collection. CP angle regions do not show evidence of mass lesion. Detailed CP angle or vascular study was not performed, but patient does have a tortuous right vertebral artery that comes close to the root entry zone of the left seventh nerve. Additionally, there appears to be an arterial vessel within the internal auditory canal on the right based on flow related signal loss, which could also be significant. Vascular: No other vascular finding. Skull and upper cervical spine: Negative Sinuses/Orbits: Clear/normal Other: None IMPRESSION: Tortuous left vertebral artery, passing close to the root entry zone of the left seventh nerve which could be significant. Additionally, there may be a small arterial branch anteriorly within the internal auditory canal that could also be relevant to the clinical presentation. If desired, one could consider posterior fossa detailed MR angiography. Brain otherwise appears normal. Electronically Signed    By: Nelson Chimes M.D.   On: 11/13/2018 09:50    Labs:  CBC: Recent Labs    03/18/18 0906 12/03/18 0843  WBC 4.5 5.8  HGB 13.8 15.0  HCT 41.3 45.8  PLT 236 276    COAGS: Recent Labs    12/03/18 0843  INR  1.0    BMP: Recent Labs    03/18/18 0906  NA 143  K 4.6  CL 102  CO2 24  GLUCOSE 88  BUN 16  CALCIUM 9.4  CREATININE 0.81  GFRNONAA 81  GFRAA 93    LIVER FUNCTION TESTS: Recent Labs    03/18/18 0906  BILITOT 0.5  AST 22  ALT 14  ALKPHOS 95  PROT 6.9  ALBUMIN 4.6     Assessment and Plan:  Left ICA cavernous segment aneurysm. Plan for image-guided diagnostic cerebral arteriogram today with Dr. Estanislado Pandy. Patient is NPO. Afebrile and WBCs WNL. She does not take blood thinners. INR 1.0 today. Patient has completed her 13 hour premedication regimen for her iodine allergy.  Risks and benefits of cerebral arteriogram were discussed with the patient including, but not limited to bleeding, infection, vascular injury, stroke, or contrast induced renal failure. This interventional procedure involves the use of X-rays and because of the nature of the planned procedure, it is possible that we will have prolonged use of X-ray fluoroscopy. Potential radiation risks to you include (but are not limited to) the following: - A slightly elevated risk for cancer  several years later in life. This risk is typically less than 0.5% percent. This risk is low in comparison to the normal incidence of human cancer, which is 33% for women and 50% for men according to the [place]. - Radiation induced injury can include skin redness, resembling a rash, tissue breakdown / ulcers and hair loss (which can be temporary or permanent).  The likelihood of either of these occurring depends on the difficulty of the procedure and whether you are sensitive to radiation due to previous procedures, disease, or genetic conditions.  IF your procedure requires a prolonged use of  radiation, you will be notified and given written instructions for further action.  It is your responsibility to monitor the irradiated area for the 2 weeks following the procedure and to notify your physician if you are concerned that you have suffered a radiation induced injury.   All of the patient's questions were answered, patient is agreeable to proceed. Consent signed and in chart.    Thank you for this interesting consult.  I greatly enjoyed meeting Margaret Webb and look forward to participating in their care.  A copy of this report was sent to the requesting provider on this date.  Electronically Signed: Earley Abide, PA-C 12/03/2018, 9:50 AM   I spent a total of 40 Minutes in face to face in clinical consultation, greater than 50% of which was counseling/coordinating care for left ICA cavernous segment aneurysm/diagnostic cerebral arteriogram.

## 2018-12-03 NOTE — Progress Notes (Signed)
Splint applied to right arm

## 2018-12-03 NOTE — Discharge Instructions (Signed)
Cerebral Angiogram, Care After This sheet gives you information about how to care for yourself after your procedure. Your health care provider may also give you more specific instructions. If you have problems or questions, contact your health care provider. What can I expect after the procedure? After your procedure, it is common to have:  Bruising and tenderness at the catheter insertion site.  A mild headache. Follow these instructions at home: Insertion site care   Follow instructions from your health care provider about how to take care of the insertion site. Make sure you: ? Wash your hands with soap and water before you change your bandage (dressing). If soap and water are not available, use hand sanitizer. ? Change your dressing as told by your health care provider. ? Leave stitches (sutures), skin glue, or adhesive strips in place. These skin closures may need to stay in place for 2 weeks or longer. If adhesive strip edges start to loosen and curl up, you may trim the loose edges. Do not remove adhesive strips completely unless your health care provider tells you to do that.  Do not apply powder or lotion to the site.  Do not take baths, swim, or use a hot tub until your health care provider says it is okay to do so.  You may shower 24-48 hours after the procedure or as told by your health care provider. ? Gently wash the site with plain soap and water. ? Pat the area dry with a clean towel. ? Do not rub the site. This may cause bleeding.  Check your incision area every day for signs of infection. Check for: ? Redness, swelling, or pain. ? Fluid or blood. ? Warmth. ? Pus or a bad smell. Activity  Rest as told by your health care provider.  Do not lift anything that is heavier than 10 lb (4.5 kg), or the limit that your health care provider tells you, until he or she says that it is safe.  Do not drive for 24 hours if you were given a medicine to help you relax  (sedative).  Do not drive or use heavy machinery while taking prescription pain medicine. General instructions   Return to your normal activities as told by your health care provider. Ask your health care provider what activities are safe for you.  If the catheter site starts to bleed, lie flat and put pressure on the site.  Drink enough fluid to keep your urine clear or pale yellow. This helps flush the contrast dye from your body.  Take over-the-counter and prescription medicines only as told by your health care provider.  Keep all follow-up visits as directed by your health care provider. This is important. Contact a health care provider if:  You have a fever or chills.  You have redness, swelling, or more pain around your insertion site.  You have fluid or blood coming from your insertion site.  The insertion site feels warm to the touch.  You have pus or a bad smell coming from your insertion site.  You have more bruising around the insertion site.  Blood collects in the tissue around the catheter site (hematoma), and the hematoma may be painful to the touch. Get help right away if:  You have vision changes or loss of vision.  The catheter insertion area swells very fast.  You have numbness or weakness on one side of your body.  You have trouble talking, or you have slurred speech or cannot speak (aphasia).  You feel confused or have trouble remembering.  You have severe pain at the catheter insertion area.  The catheter insertion area is bleeding, and the bleeding does not stop after 30 minutes of holding steady pressure on the site.  The arm or leg where the catheter was inserted is numb, tingling, or cold.  You have chest pain.  You have trouble breathing.  You have a rash.  You have trouble using the arm or leg where the catheter was inserted. These symptoms may represent a serious problem that is an emergency. Do not wait to see if the symptoms will go  away. Get medical help right away. Call your local emergency services (911 in [place] Do not drive yourself to the hospital. Summary  After your procedure, it is common to have bruising and tenderness at the site where the catheter was inserted.  If your catheter insertion site begins to bleed, put direct pressure on it until the bleeding stops.  After your procedure, it is important to rest and drink plenty of fluids.  Return to your normal activities as told by your health care provider. Ask your health care provider what activities are safe for you. This information is not intended to replace advice given to you by your health care provider. Make sure you discuss any questions you have with your health care provider. Document Released: 05/30/2013 Document Revised: 12/26/2016 Document Reviewed: 02/18/2016 Elsevier Patient Education  2020 [place]  This sheet gives you information about how to care for yourself after your procedure. Your health care provider may also give you more specific instructions. If you have problems or questions, contact your health care provider. What can I expect after the procedure? After the procedure, it is common to have:  Bruising and tenderness at the catheter insertion area. Follow these instructions at home: Medicines  Take over-the-counter and prescription medicines only as told by your health care provider. Insertion site care  Follow instructions from your health care provider about how to take care of your insertion site. Make sure you: ? Wash your hands with soap and water before you change your bandage (dressing). If soap and water are not available, use hand sanitizer. ? Change your dressing as told by your health care provider. ? Leave stitches (sutures), skin glue, or adhesive strips in place. These skin closures may need to stay in place for 2 weeks or longer. If adhesive strip edges start to loosen and curl up, you may  trim the loose edges. Do not remove adhesive strips completely unless your health care provider tells you to do that.  Check your insertion site every day for signs of infection. Check for: ? Redness, swelling, or pain. ? Fluid or blood. ? Pus or a bad smell. ? Warmth.  Do not take baths, swim, or use a hot tub until your health care provider approves.  You may shower 24-48 hours after the procedure, or as directed by your health care provider. ? Remove the dressing and gently wash the site with plain soap and water. ? Pat the area dry with a clean towel. ? Do not rub the site. That could cause bleeding.  Do not apply powder or lotion to the site. Activity   For 24 hours after the procedure, or as directed by your health care provider: ? Do not flex or bend the affected arm. ? Do not push or pull heavy objects with the affected arm. ? Do not drive yourself home  from the hospital or clinic. You may drive 24 hours after the procedure unless your health care provider tells you not to. ? Do not operate machinery or power tools.  Do not lift anything that is heavier than 10 lb (4.5 kg), or the limit that you are told, until your health care provider says that it is safe.  Ask your health care provider when it is okay to: ? Return to work or school. ? Resume usual physical activities or sports. ? Resume sexual activity. General instructions  If the catheter site starts to bleed, raise your arm and put firm pressure on the site. If the bleeding does not stop, get help right away. This is a medical emergency.  If you went home on the same day as your procedure, a responsible adult should be with you for the first 24 hours after you arrive home.  Keep all follow-up visits as told by your health care provider. This is important. Contact a health care provider if:  You have a fever.  You have redness, swelling, or yellow drainage around your insertion site. Get help right away  if:  You have unusual pain at the radial site.  The catheter insertion area swells very fast.  The insertion area is bleeding, and the bleeding does not stop when you hold steady pressure on the area.  Your arm or hand becomes pale, cool, tingly, or numb. These symptoms may represent a serious problem that is an emergency. Do not wait to see if the symptoms will go away. Get medical help right away. Call your local emergency services (911 in the [place] Do not drive yourself to the hospital. Summary  After the procedure, it is common to have bruising and tenderness at the site.  Follow instructions from your health care provider about how to take care of your radial site wound. Check the wound every day for signs of infection.  Do not lift anything that is heavier than 10 lb (4.5 kg), or the limit that you are told, until your health care provider says that it is safe. This information is not intended to replace advice given to you by your health care provider. Make sure you discuss any questions you have with your health care provider. Document Released: 02/15/2010 Document Revised: 02/18/2017 Document Reviewed: 02/18/2017 Elsevier Patient Education  2020 [place]. Moderate Conscious Sedation, Adult, Care After These instructions provide you with information about caring for yourself after your procedure. Your health care provider may also give you more specific instructions. Your treatment has been planned according to current medical practices, but problems sometimes occur. Call your health care provider if you have any problems or questions after your procedure. What can I expect after the procedure? After your procedure, it is common:  To feel sleepy for several hours.  To feel clumsy and have poor balance for several hours.  To have poor judgment for several hours.  To vomit if you eat too soon. Follow these instructions at home: For at least 24 hours after the  procedure:   Do not: ? Participate in activities where you could fall or become injured. ? Drive. ? Use heavy machinery. ? Drink alcohol. ? Take sleeping pills or medicines that cause drowsiness. ? Make important decisions or sign legal documents. ? Take care of children on your own.  Rest. Eating and drinking  Follow the diet recommended by your health care provider.  If you vomit: ? Drink water, juice, or soup when you  can drink without vomiting. ? Make sure you have little or no nausea before eating solid foods. General instructions  Have a responsible adult stay with you until you are awake and alert.  Take over-the-counter and prescription medicines only as told by your health care provider.  If you smoke, do not smoke without supervision.  Keep all follow-up visits as told by your health care provider. This is important. Contact a health care provider if:  You keep feeling nauseous or you keep vomiting.  You feel light-headed.  You develop a rash.  You have a fever. Get help right away if:  You have trouble breathing. This information is not intended to replace advice given to you by your health care provider. Make sure you discuss any questions you have with your health care provider. Document Released: 11/03/2012 Document Revised: 12/26/2016 Document Reviewed: 05/05/2015 Elsevier Patient Education  2020 [employer].

## 2018-12-03 NOTE — Procedures (Signed)
S/P 4 vessel cerebral arteriogram RT rad approach. Findings. 1.Approx 28mm LT ICA prox cavernous blister outpouching. 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

## 2018-12-06 ENCOUNTER — Encounter (HOSPITAL_COMMUNITY): Payer: Self-pay | Admitting: Interventional Radiology

## 2018-12-07 ENCOUNTER — Encounter (HOSPITAL_COMMUNITY): Payer: Self-pay

## 2018-12-13 NOTE — Progress Notes (Signed)
Virtual Visit via Video Note The purpose of this virtual visit is to provide medical care while limiting exposure to the novel coronavirus.    Consent was obtained for video visit:  Yes.   Answered questions that patient had about telehealth interaction:  Yes.   I discussed the limitations, risks, security and privacy concerns of performing an evaluation and management service by telemedicine. I also discussed with the patient that there may be a patient responsible charge related to this service. The patient expressed understanding and agreed to proceed.  Pt location: Home Physician Location: home I connected with Keyoshia Eatmon at patients initiation/request on 12/15/2018 at  8:15 AM EST by video enabled telemedicine application and verified that I am speaking with the correct person using two identifiers. Pt MRN: [id number] Pt DOB: 06-24-1960 Video Participants:  Dann Magistro was seen today in neurologic f/u for hemifacial spasm.  Her last injections for Botox were on November 05, 2018.  She reports that "it is working perfect but I think that I have facial droop."  She did have an MRI/MRA of the brain to see if we could find an etiology for the hemifacial spasm.  There was a tortuous left vertebral artery and close proximity to the entry zone of the left 7th nerve.  There was a 1.6 mm outpouching from the left cavernous ICA consistent with either an infundibulum or meningeal branch aneurysm.  She was referred to Dr. Estanislado Pandy.  She subsequently had an angiogram.  Records are reviewed.  There were 2 small focal outpouchings arising from the left internal carotid artery.  1 was a 2 mm outpouching in the left petrous cavernous junction and the other was a 1.5 mm outpouching in the caval cavernous segment, consistent with blister aneurysms.  Pt states that she is frustrated as they "didn't find a cause for my headaches."  Pt states that she was told to f/u in 2 years but she is  confused because she was told that she should f/u in 1 year.    PREVIOUS MEDICATIONS: None for hemifacial spasm  ALLERGIES:   Allergies  Allergen Reactions   Iodine     swelling   Oxycodone Nausea Only    Pt stated, "it made my stomach upset" and cognitive change    CURRENT MEDICATIONS:  Outpatient Encounter Medications as of 12/15/2018  Medication Sig   atorvastatin (LIPITOR) 40 MG tablet Take 40 mg by mouth daily.   fluticasone (FLONASE) 50 MCG/ACT nasal spray Place 1 spray into both nostrils daily as needed for allergies.    nortriptyline (PAMELOR) 50 MG capsule Take 1 capsule (50 mg total) by mouth at bedtime.   tiZANidine (ZANAFLEX) 2 MG tablet Take 1 tablet (2 mg total) by mouth at bedtime as needed for muscle spasms.   zolpidem (AMBIEN) 10 MG tablet Take 10 mg by mouth at bedtime as needed for sleep.    No facility-administered encounter medications on file as of 12/15/2018.     PAST MEDICAL HISTORY:   Past Medical History:  Diagnosis Date   Breast calcification, right 07/2012   Dental bridge present    lower   Dental crowns present    Headache(784.0)    sinus   Hypercholesteremia    Infertility    h/o with second pregnancy   Seasonal allergies     PAST SURGICAL HISTORY:   Past Surgical History:  Procedure Laterality Date   BREAST BIOPSY Right 08/09/2012   Procedure: BREAST BIOPSY WITH NEEDLE  LOCALIZATION;  Surgeon: Haywood Lasso, MD;  Location: [facility];  Service: General;  Laterality: Right;  needle localization at breast center of [place] TOE Left 11/2017   with bunionectomy   FOOT SURGERY Right 06/08/2018   HYSTEROSCOPY W/ ENDOMETRIAL ABLATION  11/09/2006   IR 3D INDEPENDENT WKST  12/03/2018   IR ANGIO INTRA EXTRACRAN SEL COM CAROTID INNOMINATE BILAT MOD SED  12/03/2018   IR ANGIO VERTEBRAL SEL SUBCLAVIAN INNOMINATE UNI L MOD SED  12/03/2018   IR ANGIO VERTEBRAL SEL  VERTEBRAL UNI R MOD SED  12/03/2018   IR US GUIDE VASC ACCESS RIGHT  12/03/2018    SOCIAL HISTORY:   Social History   Socioeconomic History   Marital status: Married    Spouse name: Not on file   Number of children: 1   Years of education: 12   Highest education level: 12th grade  Occupational History    Employer: Copywriter, advertising  Social Designer, fashion/clothing strain: Not on file   Food insecurity    Worry: Not on file    Inability: Not on file   Transportation needs    Medical: Not on file    Non-medical: Not on file  Tobacco Use   Smoking status: Never Smoker   Smokeless tobacco: Never Used  Substance and Sexual Activity   Alcohol use: Yes    Alcohol/week: 1.0 standard drinks    Types: 1 Standard drinks or equivalent per week    Comment: maybe once a month   Drug use: No   Sexual activity: Yes    Partners: Male    Birth control/protection: Other-see comments    Comment: vasectomy  Lifestyle   Physical activity    Days per week: Not on file    Minutes per session: Not on file   Stress: Not on file  Relationships   Social connections    Talks on phone: Not on file    Gets together: Not on file    Attends religious service: Not on file    Active member of club or organization: Not on file    Attends meetings of clubs or organizations: Not on file    Relationship status: Not on file   Intimate partner violence    Fear of current or ex partner: Not on file    Emotionally abused: Not on file    Physically abused: Not on file    Forced sexual activity: Not on file  Other Topics Concern   Not on file  Social History Narrative   Patient is right-handed. She lives with her husband in a one level home. She drinks one cup of coffee a day, and 2 glasses of tea a week, other times water. She exercises 3-4 x a week.     FAMILY HISTORY:   Family Status  Relation Name Status   Mother  Deceased   Father  Deceased   Sister x2 Alive   PGF  (Not  Specified)   PGM  (Not Specified)   Cousin  (Not Specified)   Brother x3 Alive   Daughter  Alive    ROS:  Review of Systems  Constitutional: Negative.   HENT: Negative.   Eyes: Negative.   Respiratory: Negative.   Gastrointestinal: Negative.   Genitourinary: Negative.   Skin: Negative.     PHYSICAL EXAMINATION:    VITALS:   There were no vitals filed for this  visit.  GEN:  Normal appears female in no acute distress.  Appears stated age. HEENT:  Normocephalic, atraumatic. The mucous membranes are moist.    NEUROLOGICAL: Orientation:  The patient is alert and oriented x 3.  Cranial nerves: There is decreased size of the left palpebral fissure compared to that of the right.  There is just mild facial asymmetry on the left compared to that of the right, but the nasolabial folds are symmetric when she smiles (same as last visit, pre-botox although pt states it is different).  Forehead wrinkle is symmetric.  She is able to hold air in both sides of the cheek.     Extraocular muscles are intact and visual fields are full to confrontational testing. Speech is fluent and clear. Soft palate rises symmetrically and there is no tongue deviation. Hearing is intact to conversational tone. Motor: Strength is at least antigravity x4. Abnormal movements: There is no hemifacial spasm noted today.   Chemistry      Component Value Date/Time   NA 138 12/03/2018 0843   NA 143 03/18/2018 0906   K 4.0 12/03/2018 0843   CL 102 12/03/2018 0843   CO2 24 12/03/2018 0843   BUN 20 12/03/2018 0843   BUN 16 03/18/2018 0906   CREATININE 0.91 12/03/2018 0843   CREATININE 0.80 11/19/2012 1131      Component Value Date/Time   CALCIUM 9.7 12/03/2018 0843   ALKPHOS 95 03/18/2018 0906   AST 22 03/18/2018 0906   ALT 14 03/18/2018 0906   BILITOT 0.5 03/18/2018 0906     Lab Results  Component Value Date   TSH 1.790 03/18/2018      IMPRESSION/PLAN  1.  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  Patient thought it caused some facial droop, but I had noted facial droop prior to the injections on my first visit with her.  I suspect that this was from the actual hemifacial spasm itself.  The patient does think it got worse after the injections.  We discussed options.  Ultimately, we decided to try Botox 1 more time, this time taking out the zygomaticus injections.  The dosage that we used was incredibly small.  -I will go ahead and repeat her MRI of the brain, since that one was not done with contrast.  She was agreeable.  2.  Headache  -Patient expresses frustration that be MRA did not show a cause for headache.  I expressed to her that that is not what we were looking for.  I was looking for cause of hemifacial spasm.  Discussed with her that most of the time, the etiology of headache is idiopathic.  She has a follow-up appointment in December to discuss further with Dr. Tomi Likens.  3.  Neck pain  -Based on her picture in epic, I thought perhaps she had some degree of cervical dystonia.  However I really did not see that on her examination today.  I will continue to follow with that.  4.  Posterior aneurysms and tortuous left vertebral  -Patient has seen Dr. Estanislado Pandy.  He felt that she did not need to do anything further.  Patient states that he told her to follow-up in 2 years.     Cc:  Aletha Halim., PA-C

## 2018-12-14 ENCOUNTER — Encounter: Payer: Self-pay | Admitting: Neurology

## 2018-12-15 ENCOUNTER — Other Ambulatory Visit: Payer: Self-pay

## 2018-12-15 ENCOUNTER — Telehealth (INDEPENDENT_AMBULATORY_CARE_PROVIDER_SITE_OTHER): Payer: BLUE CROSS/BLUE SHIELD | Admitting: Neurology

## 2018-12-15 DIAGNOSIS — G5132 Clonic hemifacial spasm, left: Secondary | ICD-10-CM

## 2018-12-15 DIAGNOSIS — R519 Headache, unspecified: Secondary | ICD-10-CM | POA: Diagnosis not present

## 2018-12-15 DIAGNOSIS — I671 Cerebral aneurysm, nonruptured: Secondary | ICD-10-CM | POA: Diagnosis not present

## 2018-12-15 DIAGNOSIS — G4486 Cervicogenic headache: Secondary | ICD-10-CM

## 2019-01-07 ENCOUNTER — Encounter: Payer: Self-pay | Admitting: Podiatry

## 2019-01-07 ENCOUNTER — Other Ambulatory Visit: Payer: Self-pay

## 2019-01-07 ENCOUNTER — Ambulatory Visit (INDEPENDENT_AMBULATORY_CARE_PROVIDER_SITE_OTHER): Payer: BLUE CROSS/BLUE SHIELD

## 2019-01-07 ENCOUNTER — Ambulatory Visit (INDEPENDENT_AMBULATORY_CARE_PROVIDER_SITE_OTHER): Payer: BLUE CROSS/BLUE SHIELD | Admitting: Podiatry

## 2019-01-07 DIAGNOSIS — M722 Plantar fascial fibromatosis: Secondary | ICD-10-CM

## 2019-01-07 DIAGNOSIS — M2011 Hallux valgus (acquired), right foot: Secondary | ICD-10-CM

## 2019-01-07 DIAGNOSIS — M779 Enthesopathy, unspecified: Secondary | ICD-10-CM

## 2019-01-07 NOTE — Progress Notes (Signed)
Subjective:   Patient ID: Margaret Webb, female   DOB: 58 y.o.   MRN: [id number]   HPI Patient presents stating it hurts on his 1 spot on my incision site and then also the back of bottom of my heel is starting to bother me   ROS      Objective:  Physical Exam  Neurovascular status intact with 1 area of irritation at the distal aspect of the incision site right with a slight increase in the thickness of the joint surface and the skin surface with discomfort on the plantar posterior aspect of the right heel     Assessment:  Possibility for low-grade inflammatory capsulitis first MPJ or just irritation of nerve tissue right along with a dry-like condition plantar posterior right with fasciitis     Plan:  H&P reviewed both conditions.  Today I did a careful injection of the dorsal capsule right and into the small area of tissue and then I did a careful fascial injection right in the plantar posterior aspect of the calcaneus fascial band.  Both were tolerated well and patient will be seen back if symptoms indicate  X-rays indicate that there does not appear to be a bone issue associated with the discomfort she is experiencing

## 2019-01-10 NOTE — Progress Notes (Signed)
Virtual Visit via Video Note The purpose of this virtual visit is to provide medical care while limiting exposure to the novel coronavirus.    Consent was obtained for video visit:  Yes.   Answered questions that patient had about telehealth interaction:  Yes.   I discussed the limitations, risks, security and privacy concerns of performing an evaluation and management service by telemedicine. I also discussed with the patient that there may be a patient responsible charge related to this service. The patient expressed understanding and agreed to proceed.  Pt location: Home Physician Location: office Name of referring provider:  Aletha Halim., PA-C I connected with Margaret Webb at patients initiation/request on 01/11/2019 at  8:30 AM EST by video enabled telemedicine application and verified that I am speaking with the correct person using two identifiers. Pt MRN:  [id number] Pt DOB:  1960-05-14 Video Participants:  Margaret Webb   History of Present Illness:  Margaret Webb who follows up for headaches.  UPDATE: Started on nortriptyline, titrated to 50mg .  They are better.  Intensity:  Much better.  Moderate.  No longer pounding Duration:  1 to 2 hours. Frequency:  1 a week Frequency of abortive medication: 1 day a week Current NSAIDS:  ibuprofen Current analgesics: none Current triptans:  none Current ergotamine:  none Current anti-emetic:  none Current muscle relaxants:  tizanidine 2mg  at bedtime PRN Current anti-anxiolytic:  none Current sleep aide:  none Current Antihypertensive medications:  none Current Antidepressant medications:  nortriptyline 50mg  Current Anticonvulsant medications:  none Current anti-CGRP:  none Current Vitamins/Herbal/Supplements:  none Current Antihistamines/Decongestants:  Flonase Other therapy:  none Hormone/birth control:  none Other medications:  none  She was referred to Dr. Carles Collet for hemifacial spasm.  She has been receiving  Botox.  MRI brain w wo and MRA of head performed on 10/17 and 11/18/2018 respectively showed tortuous left vertebral artery in close proximityto left CN VII entry zone noted, as well as 1.6 mm outpouching from left cavernous ICA consistent with infundibulum or meningeal branch aneurysm.  She was seen by Dr. Estanislado Pandy.  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  Findings will be monitored.     HISTORY: She has had headaches since her 51s.  She describes a severe stabbing pain in the right occipital region.  In July 2020, they started to occur every night.  Prior to then, they would occur 1 to 2 times a week.  She wakes up in the middle of the night (around 12 AM) with the headache and immediately takes acetaminophen-caffeine.  Headache gradually resolves by 6 or 7 AM.  No associated symptoms such as nausea, vomiting, photophobia, phonophobia, visual disturbance or unilateral numbness or weakness.  No particular trigger.  The acetaminophen-caffeine pills no longer seem as effective.  She has a constant dull bifrontal headache as well.  She has degenerative disease of the cervical spine.  She has had multiple imaging.  Last MRI of cervical spine from 11/26/16 was personally reviewed and demonstrated degeneration at C4-C5 and C5-C6 causing mild spinal stenosis and moderate foraminal stenosis, as well as mild right foraminal disc bulge at C3-C4.  She was previously treated by Dr. Sherwood Gambler and underwent injections.  She denies radicular pain, numbness or weakness in the upper extremities.  For 10 years, she has had near-constant twitching of the left eye.  For the same amount of time, she also has  noted facial asymmetry, specifically mild left lower facial droop.  She says sometimes she drools from the left side of her mouth.  MRI of brain without contrast from 11/05/12 was personally reviewed and  demonstrated some premature cerebral and cerebellar atrophy but no acute intracranial abnormalities.  Past NSAIDS:  Diclofenac 75mg  tablet, Mobic (back and neck pain) Past analgesics:  acetaminophen-caffeine Past abortive triptans:  none Past abortive ergotamine:  none Past muscle relaxants:  Baclofen (back and neck pain), Flexeril Past anti-emetic:  none Past antihypertensive medications: none Past antidepressant medications:  sertralilne 50mg  Past anticonvulsant medications:  Topiramate, gabapentin (back pain) Past anti-CGRP:  none Past vitamins/Herbal/Supplements:  none Past antihistamines/decongestants:  none Other past therapies:  Trigger point injections (possible occipital nerve blocks) which were ineffective.  Past Medical History: Past Medical History:  Diagnosis Date  . Breast calcification, right 07/2012  . Dental bridge present    lower  . Dental crowns present   . Headache(784.0)    sinus  . Hypercholesteremia   . Infertility    h/o with second pregnancy  . Seasonal allergies     Medications: Outpatient Encounter Medications as of 01/11/2019  Medication Sig  . atorvastatin (LIPITOR) 40 MG tablet Take 40 mg by mouth daily.  . fluticasone (FLONASE) 50 MCG/ACT nasal spray Place 1 spray into both nostrils daily as needed for allergies.   Marland Kitchen nortriptyline (PAMELOR) 50 MG capsule Take 1 capsule (50 mg total) by mouth at bedtime.  Marland Kitchen tiZANidine (ZANAFLEX) 2 MG tablet Take 1 tablet (2 mg total) by mouth at bedtime as needed for muscle spasms.  Marland Kitchen zolpidem (AMBIEN) 10 MG tablet Take 10 mg by mouth at bedtime as needed for sleep.    No facility-administered encounter medications on file as of 01/11/2019.    Allergies: Allergies  Allergen Reactions  . Iodine     swelling  . Oxycodone Nausea Only    Pt stated, "it made my stomach upset" and cognitive change    Family History: Family History  Problem Relation Age of Onset  . Hypertension Mother   . Heart attack  Mother 81  . Hypercholesterolemia Father   . Cancer Sister        breast, bone  . Breast cancer Sister   . Diabetes Paternal Grandfather   . Cancer Paternal Grandfather        unknown type  . Heart attack Paternal Grandmother   . Breast cancer Cousin   . Healthy Brother   . Heart attack Brother   . Healthy Daughter     Social History: Social History   Socioeconomic History  . Marital status: Married    Spouse name: Not on file  . Number of children: 1  . Years of education: 28  . Highest education level: 12th grade  Occupational History    Employer: [employer]  Tobacco Use  . Smoking status: Never Smoker  . Smokeless tobacco: Never Used  Substance and Sexual Activity  . Alcohol use: Yes    Alcohol/week: 1.0 standard drinks    Types: 1 Standard drinks or equivalent per week    Comment: maybe once a month  . Drug use: No  . Sexual activity: Yes    Partners: Male    Birth control/protection: Other-see comments    Comment: vasectomy  Other Topics Concern  . Not on file  Social History Narrative   Patient is right-handed. She lives with her husband in a one level home. She drinks one cup of coffee a  day, and 2 glasses of tea a week, other times water. She exercises 3-4 x a week.    Social Determinants of Health   Financial Resource Strain:   . Difficulty of Paying Living Expenses: Not on file  Food Insecurity:   . Worried About Charity fundraiser in the Last Year: Not on file  . Ran Out of Food in the Last Year: Not on file  Transportation Needs:   . Lack of Transportation (Medical): Not on file  . Lack of Transportation (Non-Medical): Not on file  Physical Activity:   . Days of Exercise per Week: Not on file  . Minutes of Exercise per Session: Not on file  Stress:   . Feeling of Stress : Not on file  Social Connections:   . Frequency of Communication with Friends and Family: Not on file  . Frequency of Social Gatherings with Friends and Family: Not on  file  . Attends Religious Services: Not on file  . Active Member of Clubs or Organizations: Not on file  . Attends Archivist Meetings: Not on file  . Marital Status: Not on file  Intimate Partner Violence:   . Fear of Current or Ex-Partner: Not on file  . Emotionally Abused: Not on file  . Physically Abused: Not on file  . Sexually Abused: Not on file    Observations/Objective:   Height 5\' 6"  (1.676 m), weight 192 lb (87.1 kg). No acute distress.  Alert and oriented.  Speech fluent and not dysarthric.  Language intact.  Eyes orthophoric on primary gaze.  Face symmetric.  Assessment and Plan:   1.  Occipital-cervicogenic headache, not intractable 2.  Chronic neck pain with degenerative disc disease of cervical spine 3.  Left-sided hemifacial spasm.  1.  For preventative management, nortriptyline 50mg  at bedtime 2.  For abortive therapy, ibuprofen or Tylenol.  3.  Limit use of pain relievers to no more than 2 days out of week to prevent risk of rebound or medication-overuse headache.  She reports history of back pain.  If her neck or back pain flares up, she should go ahead and treat. 4.  Keep headache diary 5.  Exercise, hydration, caffeine cessation, sleep hygiene, monitor for and avoid triggers 6.  Consider:  magnesium citrate 400mg  daily, riboflavin 400mg  daily, and coenzyme Q10 100mg  three times daily 7. Follow up 4 months.   Follow Up Instructions:    -I discussed the assessment and treatment plan with the patient. The patient was provided an opportunity to ask questions and all were answered. The patient agreed with the plan and demonstrated an understanding of the instructions.   The patient was advised to call back or seek an in-person evaluation if the symptoms worsen or if the condition fails to improve as anticipated.    Dudley Major, DO

## 2019-01-11 ENCOUNTER — Encounter: Payer: Self-pay | Admitting: Neurology

## 2019-01-11 ENCOUNTER — Telehealth (INDEPENDENT_AMBULATORY_CARE_PROVIDER_SITE_OTHER): Payer: BLUE CROSS/BLUE SHIELD | Admitting: Neurology

## 2019-01-11 ENCOUNTER — Other Ambulatory Visit: Payer: Self-pay

## 2019-01-11 VITALS — Ht 66.0 in | Wt 192.0 lb

## 2019-01-11 DIAGNOSIS — G8928 Other chronic postprocedural pain: Secondary | ICD-10-CM

## 2019-01-11 DIAGNOSIS — R519 Headache, unspecified: Secondary | ICD-10-CM | POA: Diagnosis not present

## 2019-01-11 DIAGNOSIS — M542 Cervicalgia: Secondary | ICD-10-CM

## 2019-01-11 DIAGNOSIS — G5132 Clonic hemifacial spasm, left: Secondary | ICD-10-CM

## 2019-01-11 DIAGNOSIS — G4486 Cervicogenic headache: Secondary | ICD-10-CM

## 2019-01-11 DIAGNOSIS — M5030 Other cervical disc degeneration, unspecified cervical region: Secondary | ICD-10-CM

## 2019-01-31 ENCOUNTER — Telehealth: Payer: Self-pay | Admitting: Obstetrics & Gynecology

## 2019-01-31 ENCOUNTER — Ambulatory Visit (INDEPENDENT_AMBULATORY_CARE_PROVIDER_SITE_OTHER): Payer: BLUE CROSS/BLUE SHIELD | Admitting: Obstetrics & Gynecology

## 2019-01-31 ENCOUNTER — Encounter: Payer: Self-pay | Admitting: Obstetrics & Gynecology

## 2019-01-31 ENCOUNTER — Other Ambulatory Visit: Payer: Self-pay

## 2019-01-31 VITALS — BP 126/88 | HR 76 | Temp 97.9°F | Ht 64.25 in

## 2019-01-31 DIAGNOSIS — R319 Hematuria, unspecified: Secondary | ICD-10-CM

## 2019-01-31 DIAGNOSIS — R82998 Other abnormal findings in urine: Secondary | ICD-10-CM | POA: Diagnosis not present

## 2019-01-31 LAB — POCT URINALYSIS DIPSTICK
Bilirubin, UA: NEGATIVE
Glucose, UA: NEGATIVE
Ketones, UA: NEGATIVE
Nitrite, UA: NEGATIVE
Protein, UA: NEGATIVE
Urobilinogen, UA: 0.2 E.U./dL
pH, UA: 6 (ref 5.0–8.0)

## 2019-01-31 MED ORDER — SULFAMETHOXAZOLE-TRIMETHOPRIM 800-160 MG PO TABS: 1.0000 | ORAL_TABLET | Freq: Two times a day (BID) | ORAL | 0 refills | Status: DC

## 2019-01-31 MED ORDER — PHENAZOPYRIDINE HCL 100 MG PO TABS: 100.0000 mg | ORAL_TABLET | Freq: Three times a day (TID) | ORAL | 0 refills | Status: DC | PRN

## 2019-01-31 NOTE — Progress Notes (Signed)
GYNECOLOGY  VISIT  CC:   Patient states that on 01/28/19 she started feeling pressure vaginally. Yesterday, she started having frequency and urgency, then today once she wiped there was blood on the tissue and in the toilet.  HPI: 59 y.o. G2P1 Married White or Caucasian female here for complaint of vaginal pressure that started on January 1st.  She is not having dysuria however she does not feeling "comfortable".  Denies low back pain.  Denies fever.  She's had a clammy sensation today.  She has not tried anything OTC.  Today when wiping after voiding, she noted some blood on tissue as well a little in the toilet with her urine.    Denies vaginal bleeding or any vaginal discharge.  GYNECOLOGIC HISTORY: No LMP recorded. Patient has had an ablation. Contraception: Ablation Menopausal hormone therapy: none  Patient Active Problem List   Diagnosis Date Noted  . Pain in right knee 10/22/2018  . 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 Pain in left knee 09/07/2018  . Pain of left hip joint 09/07/2018  . Trochanteric bursitis of left hip 09/07/2018  . Insomnia 07/08/2017  . Accelerated hypertension 02/23/2015  . Hyperlipidemia 02/23/2015    Past Medical History:  Diagnosis Date  . Breast calcification, right 07/2012  . Dental bridge present    lower  . Dental crowns present   . Headache(784.0)    sinus  . Hypercholesteremia   . Infertility    h/o with second pregnancy  . Seasonal allergies     Past Surgical History:  Procedure Laterality Date  . BREAST BIOPSY Right 08/09/2012   Procedure: BREAST BIOPSY WITH NEEDLE LOCALIZATION;  Surgeon: Haywood Lasso, MD;  Location: [place];  Service: General;  Laterality: Right;  needle localization at breast center of [place] [facility]   . CESAREAN SECTION    . CORRECTION HAMMER TOE Left 11/2017   with bunionectomy  . FOOT SURGERY Right 06/08/2018  . HYSTEROSCOPY W/ ENDOMETRIAL ABLATION  11/09/2006  . IR 3D INDEPENDENT [facility]KST   12/03/2018  . IR ANGIO INTRA EXTRACRAN SEL COM CAROTID INNOMINATE BILAT MOD SED  12/03/2018  . IR ANGIO VERTEBRAL SEL SUBCLAVIAN INNOMINATE UNI L MOD SED  12/03/2018  . IR ANGIO VERTEBRAL SEL VERTEBRAL UNI R MOD SED  12/03/2018  . IR US GUIDE VASC ACCESS RIGHT  12/03/2018    MEDS:   Current Outpatient Medications on File Prior to Visit  Medication Sig Dispense Refill  . atorvastatin (LIPITOR) 40 MG tablet Take 40 mg by mouth daily.    . fluticasone (FLONASE) 50 MCG/ACT nasal spray Place 1 spray into both nostrils daily as needed for allergies.     Marland Kitchen nortriptyline (PAMELOR) 50 MG capsule Take 1 capsule (50 mg total) by mouth at bedtime. 30 capsule 3  . tiZANidine (ZANAFLEX) 2 MG tablet Take 1 tablet (2 mg total) by mouth at bedtime as needed for muscle spasms. 30 tablet 3  . zolpidem (AMBIEN) 10 MG tablet Take 10 mg by mouth at bedtime as needed for sleep.      No current facility-administered medications on file prior to visit.    ALLERGIES: Iodine and Oxycodone  Family History  Problem Relation Age of Onset  . Hypertension Mother   . Heart attack Mother 42  . Hypercholesterolemia Father   . Cancer Sister        breast, bone  . Breast cancer Sister   . Diabetes Paternal Grandfather   . Cancer Paternal Grandfather  unknown type  . Heart attack Paternal Grandmother   . Breast cancer Cousin   . Healthy Brother   . Heart attack Brother   . Healthy Daughter     SH:  Married, non smoker  Review of Systems  Genitourinary: Positive for frequency, hematuria and urgency.       Vaginal pressure Discomfort with urination  All other systems reviewed and are negative.   PHYSICAL EXAMINATION:    BP 126/88 (BP Location: Right Arm, Patient Position: Sitting, Cuff Size: Large)   Pulse 76   Temp 97.9 F (36.6 C) (Temporal)   Ht 5' 4.25" (1.632 m)   BMI 32.70 kg/m     General appearance: alert, cooperative and appears stated age Abdomen: soft, non-tender; bowel sounds normal;  no masses,  no organomegaly Lymph:  no inguinal LAD noted  Pelvic: External genitalia:  no lesions              Urethra:  normal appearing urethra with no masses, tenderness or lesions              Bartholins and Skenes: normal                 Vagina: normal appearing vagina with normal color and discharge, no lesions              Cervix: no lesions              Bimanual Exam:  Uterus:  normal size, contour, position, consistency, mobility, non-tender              Adnexa: no mass, fullness, tenderness  Chaperone, Terence Lux, CMA, was present for exam.  Assessment: Gross hematuria per hx Possible cystitis with white cells in urine  Plan: Urine micro and culture pending Bactrim DS BID x 5 days Pyridium 100mg  tid x 4 prn

## 2019-01-31 NOTE — Telephone Encounter (Signed)
Spoke with patient. Patient reports constant pain and pressure when voiding, urgency, frequency, voiding none to small amounts. Symptoms started on 1/3. Woke up this morning, noticed blood on tissue when wiping, is unsure if it is in urine or from vagina. Denies fever/chills, N/V, vag odor. [id number] prescreen negative, precautions reviewed. OV scheduled for today at 4pm with Dr. Sabra Heck.   Routing to provider for final review. Patient is agreeable to disposition. Will close encounter.

## 2019-01-31 NOTE — Telephone Encounter (Signed)
Patient sent the following correspondence through [place].  Good morning Dr. Sabra Heck,  Happy New Year  I think I have a bladder infection my symptoms are Constant pressure/pain when I go to the bathroom and if I can't pee I am bleeding, do I need to make an appointment?  Thanks Margaret Webb

## 2019-02-01 LAB — URINALYSIS, MICROSCOPIC ONLY: Casts: NONE SEEN /lpf

## 2019-02-02 LAB — URINE CULTURE

## 2019-02-03 ENCOUNTER — Ambulatory Visit (INDEPENDENT_AMBULATORY_CARE_PROVIDER_SITE_OTHER): Payer: BLUE CROSS/BLUE SHIELD | Admitting: Neurology

## 2019-02-03 ENCOUNTER — Other Ambulatory Visit: Payer: Self-pay

## 2019-02-03 DIAGNOSIS — G5132 Clonic hemifacial spasm, left: Secondary | ICD-10-CM

## 2019-02-03 MED ORDER — ONABOTULINUMTOXINA 100 UNITS IJ SOLR
50.0000 [IU] | Freq: Once | INTRAMUSCULAR | Status: AC
  Administered 2019-02-03: 50 [IU] via INTRAMUSCULAR

## 2019-02-03 NOTE — Procedures (Signed)
[facility]   History:  Diagnosis: Hemifacial spasm   Initial side: left  Result History  Onset of effect: helped  Duration of Benefit: n/a  Adverse Effects:  felt some increased L face droop so we decided to hold zygomaticus injections  Consent obtained from: The patient Benefits discussed included, but were not limited to decreased muscle tightness, increased joint range of motion, and decreased pain.  Risk discussed included, but were not limited pain and discomfort, bleeding, bruising, excessive weakness, venous thrombosis, muscle atrophy and dysphagia.  A copy of the patient medication guide was given to the patient which explains the blackbox warning.  Patients identity and treatment sites confirmed Yes.  .  Details of Procedure: Skin was cleaned with alcohol.  A 30 gauge, 1/2 inch needle was introduced to the target muscle.  Prior to injection, the needle plunger was aspirated to make sure the needle was not within a blood vessel.  There was no blood retrieved on aspiration.    Following is a summary of the muscles injected  And the amount of Botulinum toxin used:  Injections  Location Left  Right Units Number of sites        Corrugator      Frontalis      Lower Lid, Lateral 2.5  2.5 1  Lower Lid Medial 2.5  2.5 1  Upper Lid, Lateral 2.5  2.5 1  Upper Lid, Medial      Canthus 5.0  5.0 1  Temporalis      Nasalis 2.5  2.5 1  Procerus      Zygomaticus Major      TOTAL UNITS:   15    Agent: Botulinum Type A ( Onobotulinum Toxin type A ).  1 vials of Botox were used, each containing 50 units and freshly diluted with 2 mL of sterile, non-perserved saline   Total injected (Units):15  Total wasted (Units): 35 Pt tolerated procedure well without complications.   Reinjection is anticipated in 3 months.

## 2019-02-04 ENCOUNTER — Ambulatory Visit: Payer: BLUE CROSS/BLUE SHIELD | Admitting: Neurology

## 2019-02-18 ENCOUNTER — Ambulatory Visit: Payer: BC Managed Care – PPO | Admitting: Neurology

## 2019-03-03 ENCOUNTER — Other Ambulatory Visit: Payer: Self-pay | Admitting: Neurology

## 2019-03-03 MED ORDER — NORTRIPTYLINE HCL 50 MG PO CAPS: 50.0000 mg | ORAL_CAPSULE | Freq: Every day | ORAL | 3 refills | Status: DC

## 2019-04-18 ENCOUNTER — Other Ambulatory Visit: Payer: Self-pay

## 2019-04-18 ENCOUNTER — Ambulatory Visit (INDEPENDENT_AMBULATORY_CARE_PROVIDER_SITE_OTHER): Payer: BC Managed Care – PPO

## 2019-04-18 ENCOUNTER — Ambulatory Visit (INDEPENDENT_AMBULATORY_CARE_PROVIDER_SITE_OTHER): Payer: BC Managed Care – PPO | Admitting: Podiatry

## 2019-04-18 ENCOUNTER — Encounter: Payer: Self-pay | Admitting: Podiatry

## 2019-04-18 ENCOUNTER — Other Ambulatory Visit: Payer: Self-pay | Admitting: Podiatry

## 2019-04-18 VITALS — Temp 97.8°F

## 2019-04-18 DIAGNOSIS — R609 Edema, unspecified: Secondary | ICD-10-CM

## 2019-04-18 DIAGNOSIS — M79671 Pain in right foot: Secondary | ICD-10-CM

## 2019-04-18 DIAGNOSIS — M778 Other enthesopathies, not elsewhere classified: Secondary | ICD-10-CM

## 2019-04-18 DIAGNOSIS — M79672 Pain in left foot: Secondary | ICD-10-CM | POA: Diagnosis not present

## 2019-04-18 DIAGNOSIS — L309 Dermatitis, unspecified: Secondary | ICD-10-CM

## 2019-04-19 ENCOUNTER — Encounter: Payer: Self-pay | Admitting: Podiatry

## 2019-04-19 NOTE — Telephone Encounter (Signed)
Lets order compounding cream that has cortisone. Please remind me tomorrow

## 2019-04-19 NOTE — Progress Notes (Signed)
Subjective:   Patient ID: Margaret Webb, female   DOB: 59 y.o.   MRN: [id number]   HPI Patient presents stating the heel still really bothers her right and she has some inflammation around the big toe joint that is sporadic and worse in the evenings but present   ROS      Objective:  Physical Exam  Neurovascular status intact with patient continuing to have some dry irritated skin on the plantar aspect of the right heel and slight redness around the distal aspect first metatarsal were previous McBride bunionectomy had been performed     Assessment:  Dry skin which may be related to just overall structure or possible previous cortisone injections with irritation even though its not directly on the spot injections were done.  Patient's right first MPJ has never shown any indications of drainage or dehiscence but it is somewhat irritated upon deep pressure     Plan:  Difficult to say whether this may be related strictly to the postoperative.  From where there could be any pathology associated with the previous surgery but at this point no drainage or other pathology so we will give it time and for the heel we will use cream to try to control any symptoms and I do not want to do injection currently but may be necessary again in future  X-rays indicate the bone was resected from the first metatarsal there is slight lucency just on the medial side but it is not changed from previous visit and does not make sense that this would be infection but I do want to watch it and we will see patient back within the next 2 months

## 2019-04-20 MED ORDER — NONFORMULARY OR COMPOUNDED ITEM: 3 refills | Status: DC

## 2019-04-20 MED ORDER — NONFORMULARY OR COMPOUNDED ITEM: 1 refills | Status: DC

## 2019-04-20 NOTE — Telephone Encounter (Addendum)
I spoke with Gerald Stabs with [facility] and he states if you would like in one of the pain creams (like Achilles cream) he could mix in a Dexamethasone that would equal 0.4mg  in 131ml. Dr. Paulla Dolly states Dexamethasone 0.4mg  in 100gm is good, but would like in Dry Feet compound. I called [place] [facility] states fax the orders and he will review. Faxed orders to [facility] for Dry Feet Compound with Dexamethasone 0.4mg  in 100grams.

## 2019-05-06 ENCOUNTER — Ambulatory Visit: Payer: BLUE CROSS/BLUE SHIELD | Admitting: Neurology

## 2019-05-17 ENCOUNTER — Ambulatory Visit: Payer: BLUE CROSS/BLUE SHIELD | Admitting: Neurology

## 2019-05-20 ENCOUNTER — Ambulatory Visit: Payer: BLUE CROSS/BLUE SHIELD | Admitting: Neurology

## 2019-06-17 ENCOUNTER — Ambulatory Visit: Payer: BC Managed Care – PPO | Admitting: Podiatry

## 2019-06-22 ENCOUNTER — Ambulatory Visit: Payer: BC Managed Care – PPO | Admitting: Podiatry

## 2019-07-04 ENCOUNTER — Other Ambulatory Visit: Payer: Self-pay

## 2019-07-04 ENCOUNTER — Encounter: Payer: Self-pay | Admitting: Podiatry

## 2019-07-04 ENCOUNTER — Ambulatory Visit (INDEPENDENT_AMBULATORY_CARE_PROVIDER_SITE_OTHER): Payer: BC Managed Care – PPO | Admitting: Podiatry

## 2019-07-04 DIAGNOSIS — M722 Plantar fascial fibromatosis: Secondary | ICD-10-CM

## 2019-07-04 DIAGNOSIS — M779 Enthesopathy, unspecified: Secondary | ICD-10-CM

## 2019-07-04 NOTE — Progress Notes (Signed)
Subjective:   Patient ID: Margaret Webb, female   DOB: 59 y.o.   MRN: [id number]   HPI Patient presents stating I am still having some pain in his right foot in 3 different areas and 2 of them have moved   ROS      Objective:  Physical Exam  Neurovascular status intact with patient finding to have a more proximal pain on the right plantar heel and has discomfort around the fifth MPJ right with fluid buildup along with continued low-grade irritation inflammation around the first MPJ right with no redness no drainage or indications of any kind of infective process     Assessment:  Appears to be inflammatory capsulitis first and fifth MPJ and a proximal plantar fascial condition right     Plan:  H&P reviewed all conditions today did sterile prep injected the plantar fascial more proximal right 3 mg Kenalog 5 g Xylocaine and injected the fifth MPJ and around the first MPJ capsule with 1 mg dexamethasone 1 mg Kenalog 3 mg Xylocaine

## 2019-07-25 ENCOUNTER — Other Ambulatory Visit: Payer: Self-pay

## 2019-07-25 MED ORDER — NORTRIPTYLINE HCL 75 MG PO CAPS: 75.0000 mg | ORAL_CAPSULE | Freq: Every day | ORAL | 0 refills | Status: DC

## 2019-07-26 NOTE — Progress Notes (Signed)
59 y.o. G2P1 Married White or Caucasian female here for annual exam.  Having some foot pain this year.  Having injections and this helped.  Is also seeing chiropractor this year.  Right leg is longer than the left leg.  Using lift in shoe now. Only had it a week.  Not sure how much this is going to help.  Chiropractor thinks he will be able to get her off medication for headaches.    Denies vaginal bleeding.    Patient's last menstrual period was 10/28/2006 (approximate).          Sexually active: Yes.    The current method of family planning is post menopausal status & vasectomy.    Exercising: Yes.    walking Smoker:  no  Health Maintenance: Pap:  02-23-15 neg HPV HR neg, 03-18-2018 neg History of abnormal Pap:  no MMG:  10/2018 bilateral & left breast u/s category b density birads 2:neg Colonoscopy:  10-31-16 normal f/u 34yrs BMD:   none TDaP:  2016 Pneumonia vaccine(s):  none Shingrix:   none Hep C testing: neg 2018 Screening Labs: lipids and CMP   reports that she has never smoked. She has never used smokeless tobacco. She reports current alcohol use of about 1.0 standard drink of alcohol per week. She reports that she does not use drugs.  Past Medical History:  Diagnosis Date  . Breast calcification, right 07/2012  . Dental bridge present    lower  . Dental crowns present   . Headache(784.0)    sinus  . Hypercholesteremia   . Infertility    h/o with second pregnancy  . Seasonal allergies     Past Surgical History:  Procedure Laterality Date  . BREAST BIOPSY Right 08/09/2012   Procedure: BREAST BIOPSY WITH NEEDLE LOCALIZATION;  Surgeon: Haywood Lasso, MD;  Location: [facility];  Service: General;  Laterality: Right;  needle localization at breast center of [place] 10   . CESAREAN SECTION    . CORRECTION HAMMER TOE Left 11/2017   with bunionectomy  . FOOT SURGERY Right 06/08/2018  . HYSTEROSCOPY W/ ENDOMETRIAL ABLATION  11/09/2006  . IR 3D INDEPENDENT  [facility]  12/03/2018  . IR ANGIO INTRA EXTRACRAN SEL COM CAROTID INNOMINATE BILAT MOD SED  12/03/2018  . IR ANGIO VERTEBRAL SEL SUBCLAVIAN INNOMINATE UNI L MOD SED  12/03/2018  . IR ANGIO VERTEBRAL SEL VERTEBRAL UNI R MOD SED  12/03/2018  . IR US GUIDE VASC ACCESS RIGHT  12/03/2018    Current Outpatient Medications  Medication Sig Dispense Refill  . atorvastatin (LIPITOR) 40 MG tablet Take 40 mg by mouth daily.    . fluticasone (FLONASE) 50 MCG/ACT nasal spray Place 1 spray into both nostrils daily as needed for allergies.     Marland Kitchen nortriptyline (PAMELOR) 75 MG capsule Take 1 capsule (75 mg total) by mouth at bedtime. 30 capsule 0  . tiZANidine (ZANAFLEX) 2 MG tablet Take 1 tablet (2 mg total) by mouth at bedtime as needed for muscle spasms. 30 tablet 3  . zolpidem (AMBIEN) 10 MG tablet Take 10 mg by mouth at bedtime as needed for sleep.      No current facility-administered medications for this visit.    Family History  Problem Relation Age of Onset  . Hypertension Mother   . Heart attack Mother 34  . Hypercholesterolemia Father   . Cancer Sister        breast, bone  . Breast cancer Sister   . Diabetes Paternal  Grandfather   . Cancer Paternal Grandfather        unknown type  . Heart attack Paternal Grandmother   . Breast cancer Cousin   . Healthy Brother   . Heart attack Brother   . Healthy Daughter     Review of Systems  Constitutional: Negative.   HENT: Negative.   Eyes: Negative.   Respiratory: Negative.   Cardiovascular: Negative.   Gastrointestinal: Negative.   Endocrine: Negative.   Genitourinary: Negative.   Musculoskeletal: Negative.   Skin: Negative.   Allergic/Immunologic: Negative.   Neurological: Negative.   Hematological: Negative.   Psychiatric/Behavioral: Negative.     Exam:   BP 118/70   Pulse 70   Resp 16   Ht 5\' 4"  (1.626 m)   Wt 191 lb (86.6 kg)   LMP 10/28/2006 (Approximate)   BMI 32.79 kg/m   Height: 5\' 4"  (162.6 cm)  General appearance: alert,  cooperative and appears stated age Head: Normocephalic, without obvious abnormality, atraumatic Neck: no adenopathy, supple, symmetrical, trachea midline and thyroid normal to inspection and palpation Lungs: clear to auscultation bilaterally Breasts: normal appearance, no masses or tenderness Heart: regular rate and rhythm Abdomen: soft, non-tender; bowel sounds normal; no masses,  no organomegaly Extremities: extremities normal, atraumatic, no cyanosis or edema Skin: Skin color, texture, turgor normal. No rashes or lesions Lymph nodes: Cervical, supraclavicular, and axillary nodes normal. No abnormal inguinal nodes palpated Neurologic: Grossly normal   Pelvic: External genitalia:  no lesions              Urethra:  normal appearing urethra with no masses, tenderness or lesions              Bartholins and Skenes: normal                 Vagina: normal appearing vagina with normal color and discharge, no lesions              Cervix: no lesions              Pap taken: No. Bimanual Exam:  Uterus:  normal size, contour, position, consistency, mobility, non-tender              Adnexa: normal adnexa and no mass, fullness, tenderness               Rectovaginal: Confirms               Anus:  normal sphincter tone, no lesions  Chaperone, Royal Hawthorn, CMA, was present for exam.  A:  Well Woman with normal exam PMP, no HRT Family hx of breast cancer, sister diagnosed in late 63's Elevated lipids OAB  P:   Mammogram guidelines reviewed.   pap smear with HR HPV obtained today Fasting lipids and CMP obtained Tyrer cusick model for breast cancer risk is 14.2%.  Abbreviated breast MRI discussed.  She declines for now. Colonoscopy UTD Plan BMD age 70 Declines shingrix vaccination this year return annually or prn

## 2019-08-04 ENCOUNTER — Other Ambulatory Visit: Payer: Self-pay

## 2019-08-04 ENCOUNTER — Encounter: Payer: Self-pay | Admitting: Obstetrics & Gynecology

## 2019-08-04 ENCOUNTER — Other Ambulatory Visit (HOSPITAL_COMMUNITY)
Admission: RE | Admit: 2019-08-04 | Discharge: 2019-08-04 | Disposition: A | Discharge status: Home / Self Care | Payer: BC Managed Care – PPO | Source: Ambulatory Visit | Attending: Obstetrics & Gynecology | Admitting: Obstetrics & Gynecology

## 2019-08-04 ENCOUNTER — Ambulatory Visit (INDEPENDENT_AMBULATORY_CARE_PROVIDER_SITE_OTHER): Payer: BC Managed Care – PPO | Admitting: Obstetrics & Gynecology

## 2019-08-04 VITALS — BP 118/70 | HR 70 | Resp 16 | Ht 64.0 in | Wt 191.0 lb

## 2019-08-04 DIAGNOSIS — Z124 Encounter for screening for malignant neoplasm of cervix: Secondary | ICD-10-CM

## 2019-08-04 DIAGNOSIS — Z01419 Encounter for gynecological examination (general) (routine) without abnormal findings: Secondary | ICD-10-CM | POA: Diagnosis not present

## 2019-08-04 DIAGNOSIS — Z0000 Encounter for general adult medical examination without abnormal findings: Secondary | ICD-10-CM | POA: Diagnosis not present

## 2019-08-04 DIAGNOSIS — E785 Hyperlipidemia, unspecified: Secondary | ICD-10-CM | POA: Diagnosis not present

## 2019-08-04 NOTE — Patient Instructions (Signed)
[facility] [place],  [place]  [id number] Get Driving Directions Main: [phone]  Fax: [phone]  Sunday:Closed Monday:8:00 AM - 5:00 PM Tuesday:8:00 AM - 5:00 PM Wednesday:8:00 AM - 5:00 PM Thursday:8:00 AM - 5:00 PM Friday:8:00 AM - 5:00 PM Saturday:Closed

## 2019-08-05 LAB — COMPREHENSIVE METABOLIC PANEL
ALT: 20 IU/L (ref 0–32)
AST: 23 IU/L (ref 0–40)
Albumin/Globulin Ratio: 1.7 (ref 1.2–2.2)
Albumin: 4.3 g/dL (ref 3.8–4.9)
Alkaline Phosphatase: 102 IU/L (ref 48–121)
BUN/Creatinine Ratio: 20 (ref 9–23)
BUN: 16 mg/dL (ref 6–24)
Bilirubin Total: 0.4 mg/dL (ref 0.0–1.2)
CO2: 29 mmol/L (ref 20–29)
Calcium: 9.3 mg/dL (ref 8.7–10.2)
Chloride: 101 mmol/L (ref 96–106)
Creatinine, Ser: 0.8 mg/dL (ref 0.57–1.00)
GFR calc Af Amer: 94 mL/min/{1.73_m2} (ref >59)
GFR calc non Af Amer: 82 mL/min/{1.73_m2} (ref >59)
Globulin, Total: 2.5 g/dL (ref 1.5–4.5)
Glucose: 85 mg/dL (ref 65–99)
Potassium: 4.4 mmol/L (ref 3.5–5.2)
Sodium: 141 mmol/L (ref 134–144)
Total Protein: 6.8 g/dL (ref 6.0–8.5)

## 2019-08-05 LAB — LIPID PANEL
Chol/HDL Ratio: 3.6 ratio (ref 0.0–4.4)
Cholesterol, Total: 229 mg/dL — ABNORMAL HIGH (ref 100–199)
HDL: 64 mg/dL (ref >39)
LDL Chol Calc (NIH): 156 mg/dL — ABNORMAL HIGH (ref 0–99)
Triglycerides: 55 mg/dL (ref 0–149)
VLDL Cholesterol Cal: 9 mg/dL (ref 5–40)

## 2019-08-07 LAB — CYTOLOGY - PAP
Comment: NEGATIVE
Diagnosis: NEGATIVE
Diagnosis: REACTIVE
High risk HPV: NEGATIVE

## 2019-08-08 ENCOUNTER — Telehealth: Payer: Self-pay

## 2019-08-08 ENCOUNTER — Encounter: Payer: Self-pay | Admitting: Obstetrics & Gynecology

## 2019-08-08 NOTE — Telephone Encounter (Signed)
[facility] Good morning Dr. Sabra Heck,   We spoke about a doctor that you were recommending that I go see for my neck pains and the laser treatment I thought the name would be in [facility] notes but I'm not seeing the name and the phone number when you get a chance if you could provide that information I would appreciate it thank you and have a great day.   Thanks  Verdis Frederickson

## 2019-08-08 NOTE — Telephone Encounter (Signed)
Reviewed 08/04/19 [facility] notes.  MyChart message to patient.   Routing to Dr. Lestine Box.   Encounter closed.

## 2019-08-22 ENCOUNTER — Encounter: Payer: Self-pay | Admitting: Podiatry

## 2019-09-05 ENCOUNTER — Ambulatory Visit: Payer: BC Managed Care – PPO | Admitting: Podiatry

## 2019-09-26 ENCOUNTER — Other Ambulatory Visit: Payer: Self-pay

## 2019-09-26 ENCOUNTER — Ambulatory Visit (INDEPENDENT_AMBULATORY_CARE_PROVIDER_SITE_OTHER): Payer: BC Managed Care – PPO | Admitting: Podiatry

## 2019-09-26 ENCOUNTER — Encounter: Payer: Self-pay | Admitting: Podiatry

## 2019-09-26 VITALS — Temp 97.2°F

## 2019-09-26 DIAGNOSIS — M722 Plantar fascial fibromatosis: Secondary | ICD-10-CM | POA: Diagnosis not present

## 2019-09-26 DIAGNOSIS — L309 Dermatitis, unspecified: Secondary | ICD-10-CM

## 2019-09-26 MED ORDER — GABAPENTIN 300 MG PO CAPS: 300.0000 mg | ORAL_CAPSULE | Freq: Three times a day (TID) | ORAL | 3 refills | Status: DC

## 2019-09-26 NOTE — Progress Notes (Signed)
Subjective:   Patient ID: Margaret Webb, female   DOB: 59 y.o.   MRN: [id number]   HPI Patient presents stating I have got some irritated tissue on the bottom of my heel and I get a stabbing pain occasionally and it.  Patient states previous treatments have not been successful   ROS      Objective:  Physical Exam  Neurovascular status intact with tissue on the plantar aspect of the right heel proximal portion that is more within skin versus any kind of deep fascial like inflammation     Assessment:  Very difficult to ascertain as to why she has this irritated skin but it appears to be dried and cracked     Plan:  Reviewed this patient with Dr. Sherryle Lis and we are in agreement that this is a very difficult issue to do a lot with and at this time I placed her in a fleece booty we will try her on gabapentin and continue skin creams and cream for psoriasis and will be seen back in about 3 months

## 2019-10-10 ENCOUNTER — Other Ambulatory Visit: Payer: Self-pay | Admitting: Obstetrics & Gynecology

## 2019-10-10 DIAGNOSIS — Z1231 Encounter for screening mammogram for malignant neoplasm of breast: Secondary | ICD-10-CM

## 2019-10-11 IMAGING — MG MM DIGITAL DIAGNOSTIC UNILAT*L* W/ TOMO W/ CAD
4 series · 4 of 12 positions shown · non-contrast
Comparison: Previous exam(s).

CLINICAL DATA: Recall from screening mammography with
tomosynthesis, possible mass in the subareolar LEFT breast.

EXAM:
DIGITAL DIAGNOSTIC LEFT MAMMOGRAM WITH TOMO
ULTRASOUND LEFT BREAST

[L CC synth-2D]
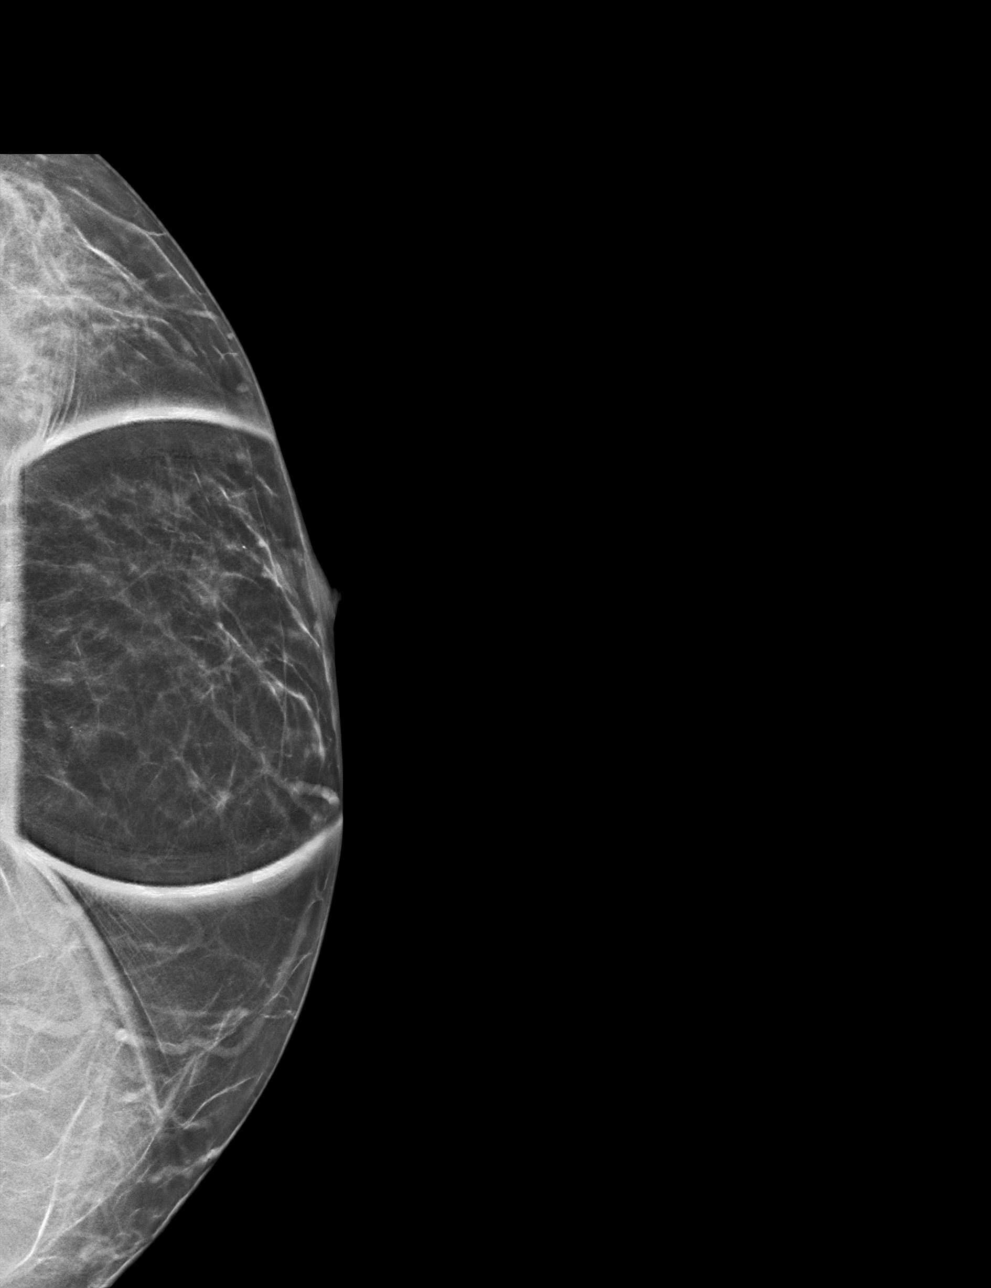

[L MLO synth-2D]
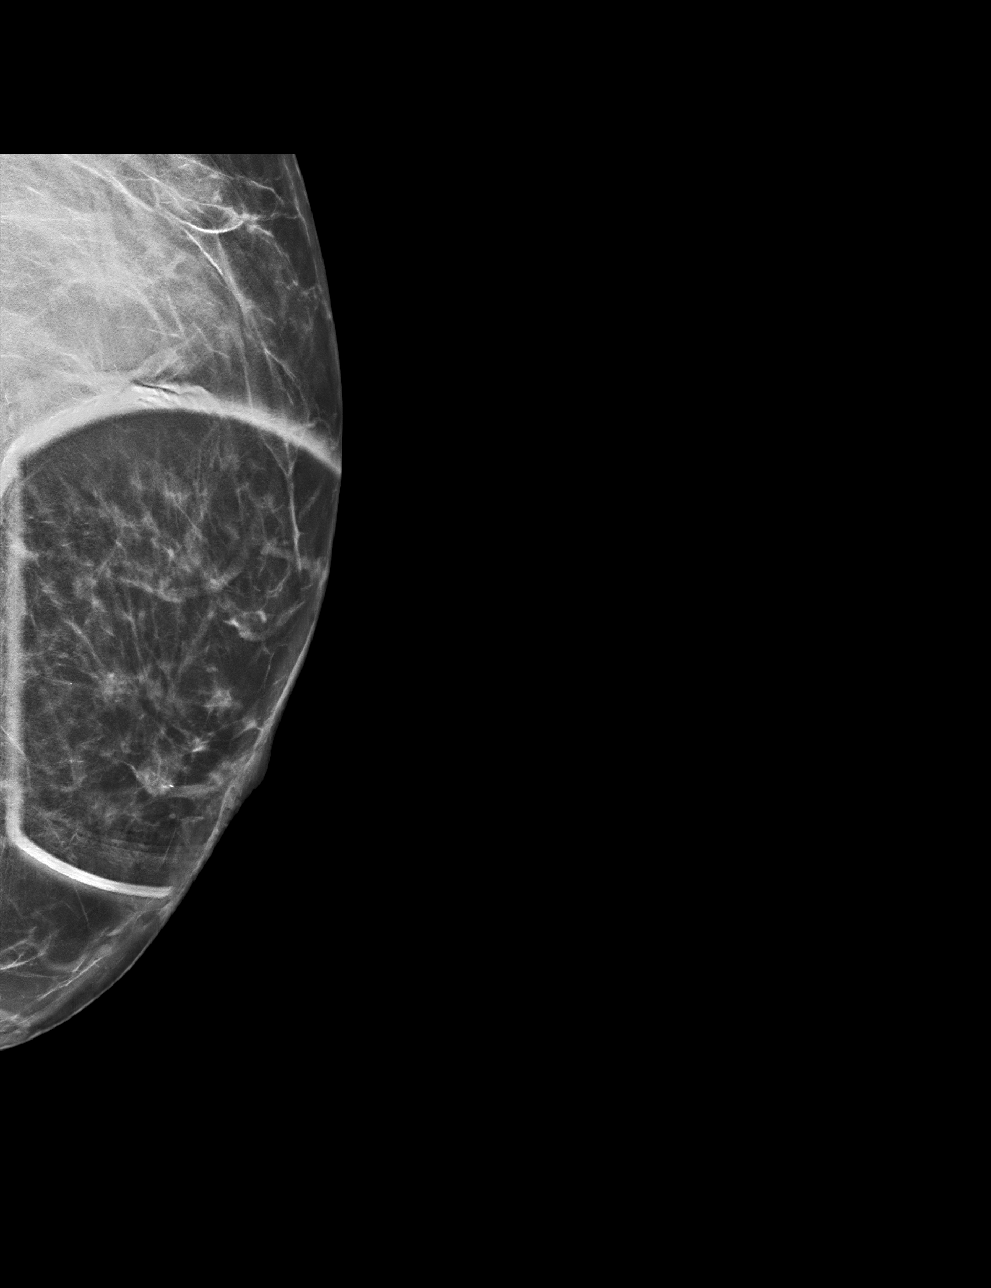

[L MLO tomo · tomo slice 31/62.0]
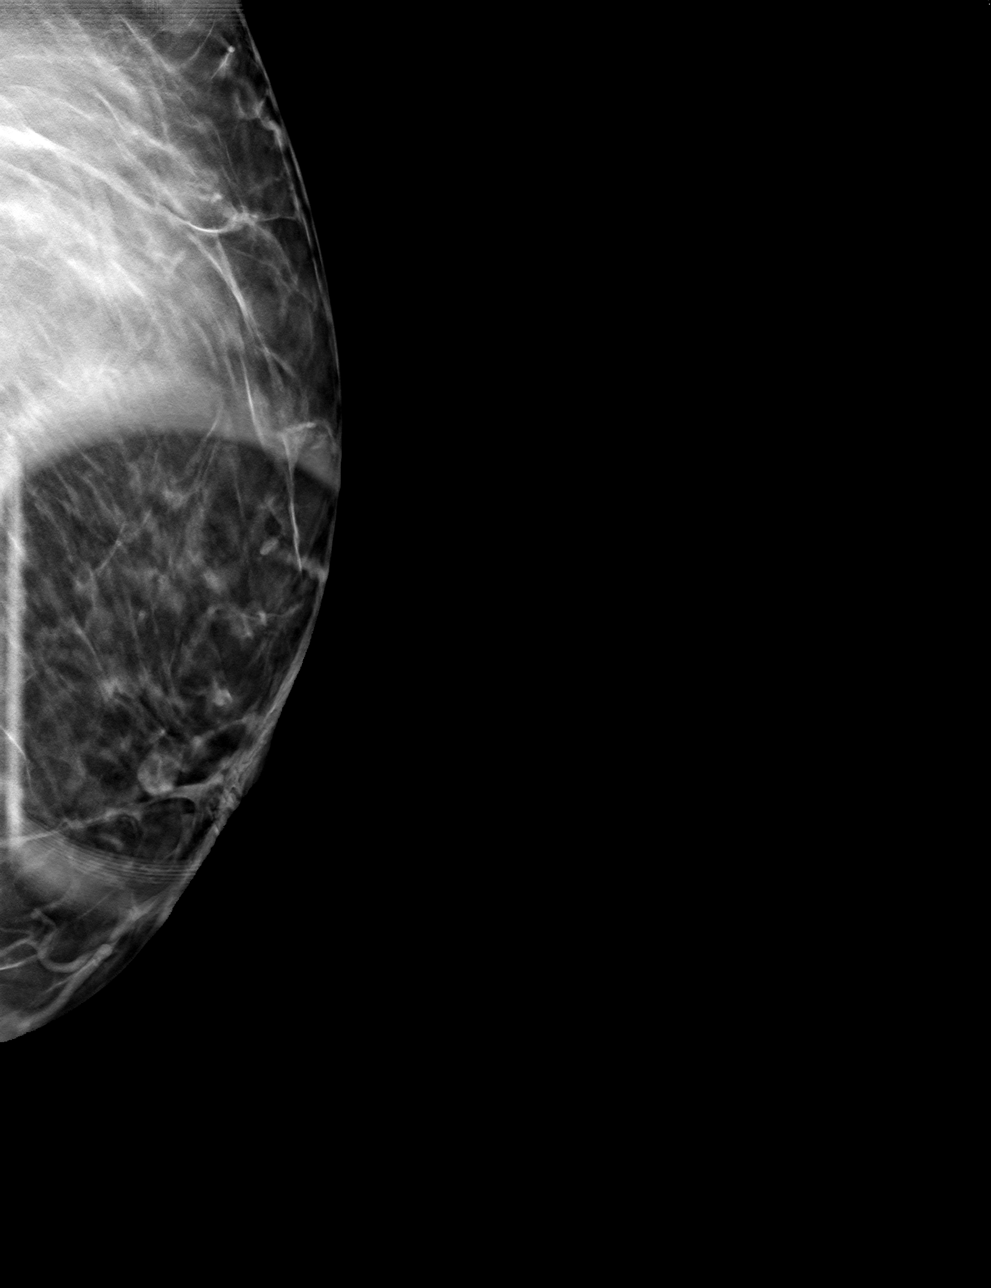

[L CC tomo · tomo slice 25/49.0]
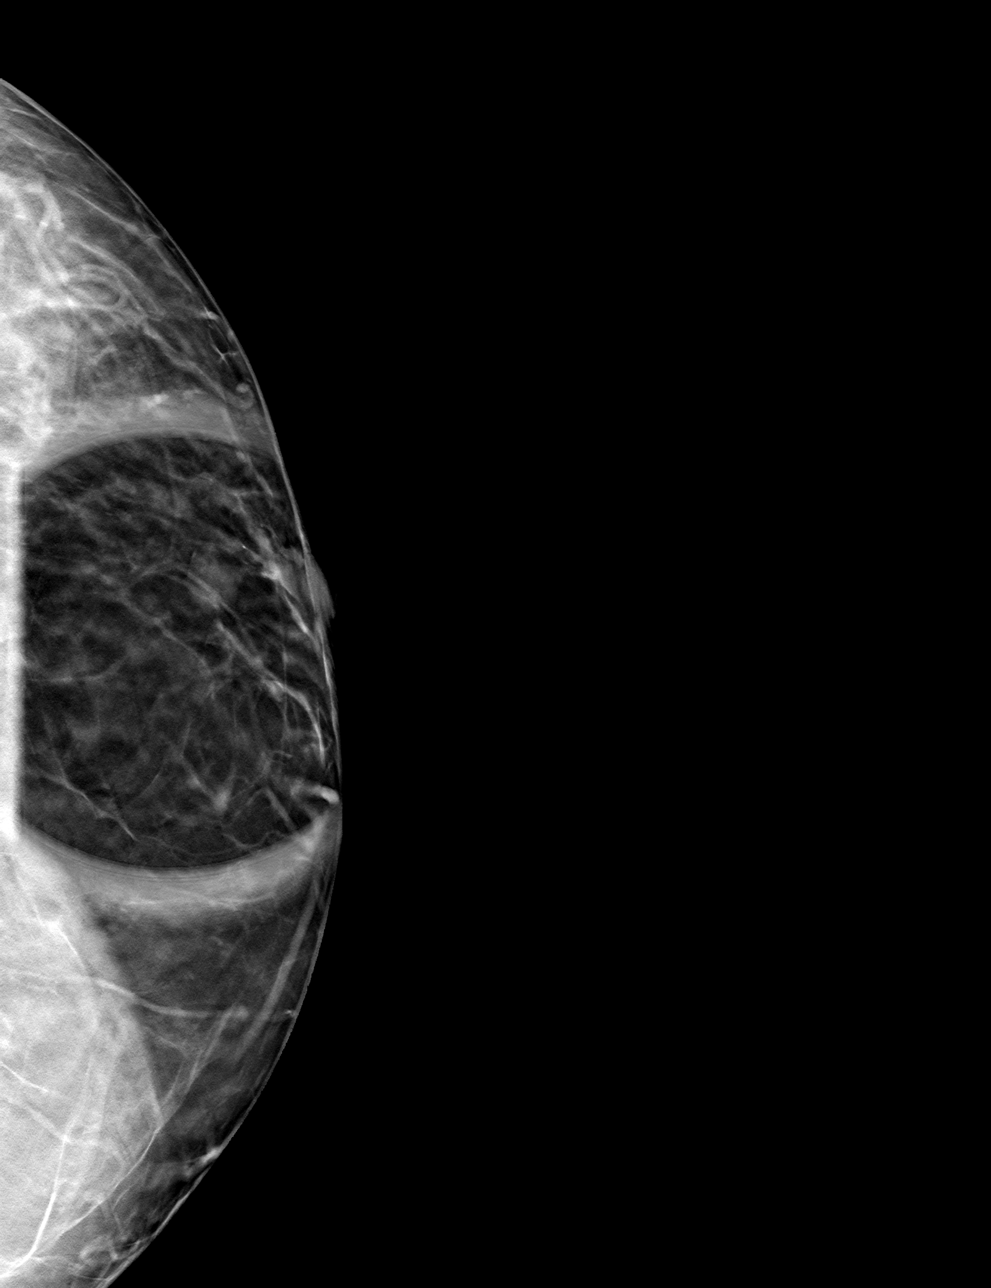

[4 of 12 positions shown; findings below may reference images not displayed]

ACR Breast Density Category b: There are scattered areas of
fibroglandular density.
FINDINGS: Tomosynthesis and synthesized spot-compression CC and MLO views of
the area of concern in the LEFT breast were obtained.

There is a circumscribed oval low-density mass measuring
approximately 1 cm in the subareolar location. There is no
associated architectural distortion or suspicious calcifications.

On correlative physical exam, there is no palpable abnormality in
the subareolar LEFT breast.

Targeted LEFT breast ultrasound is performed, showing an oval
circumscribed parallel benign simple cyst at the 12 o'clock
subareolar location measuring approximately 5 x 9 x 7 mm,
demonstrating posterior acoustic enhancement and no internal power
Doppler flow, corresponding to the screening mammographic finding.
IMPRESSION: 1. No mammographic or sonographic evidence of malignancy involving
the LEFT breast.
2. Benign simple cyst in the subareolar LEFT breast which accounts
for the screening mammographic finding.

RECOMMENDATION:
Screening mammogram in one year.(Code:WB-T-UTX)

I have discussed the findings and recommendations with the patient.
If applicable, a reminder letter will be sent to the patient
regarding the next appointment.

BI-RADS CATEGORY  2: Benign.

## 2019-10-11 IMAGING — US US BREAST*L* LIMITED INC AXILLA
1 series · 5 of 5 positions shown · non-contrast
Comparison: Previous exam(s).

CLINICAL DATA: Recall from screening mammography with
tomosynthesis, possible mass in the subareolar LEFT breast.

EXAM:
DIGITAL DIAGNOSTIC LEFT MAMMOGRAM WITH TOMO
ULTRASOUND LEFT BREAST

[Series 1: us breast*left* limited inc axilla · 0.05mm/px · 5 of 5 slices shown]
[im 1/5]
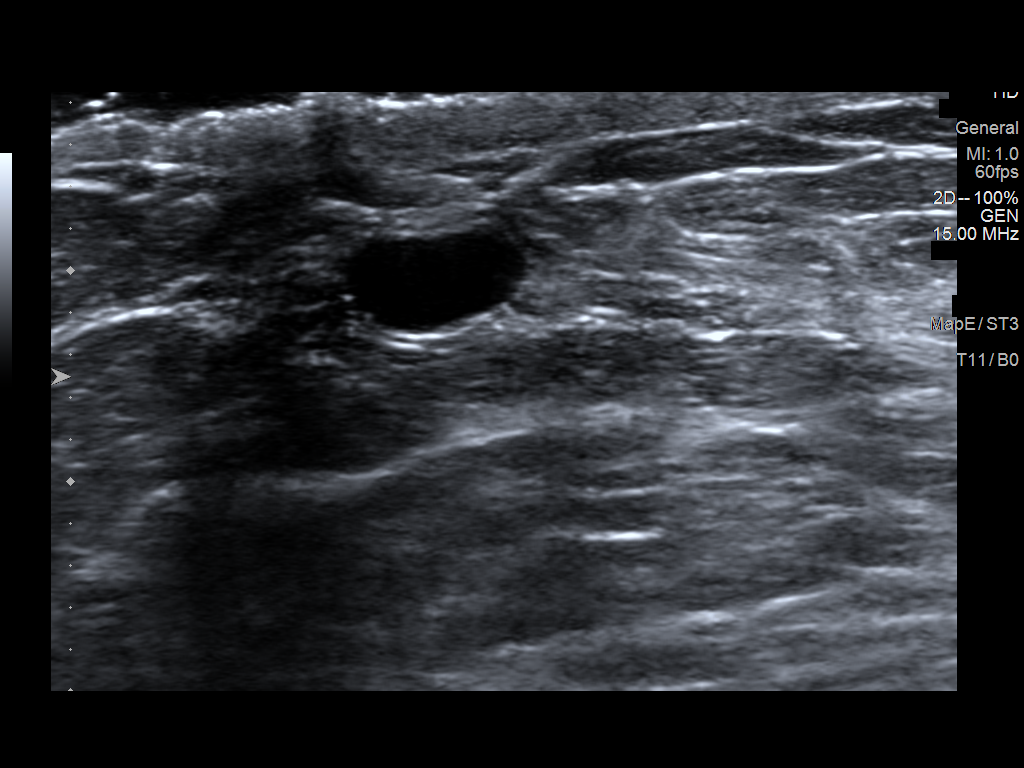
[im 2/5]
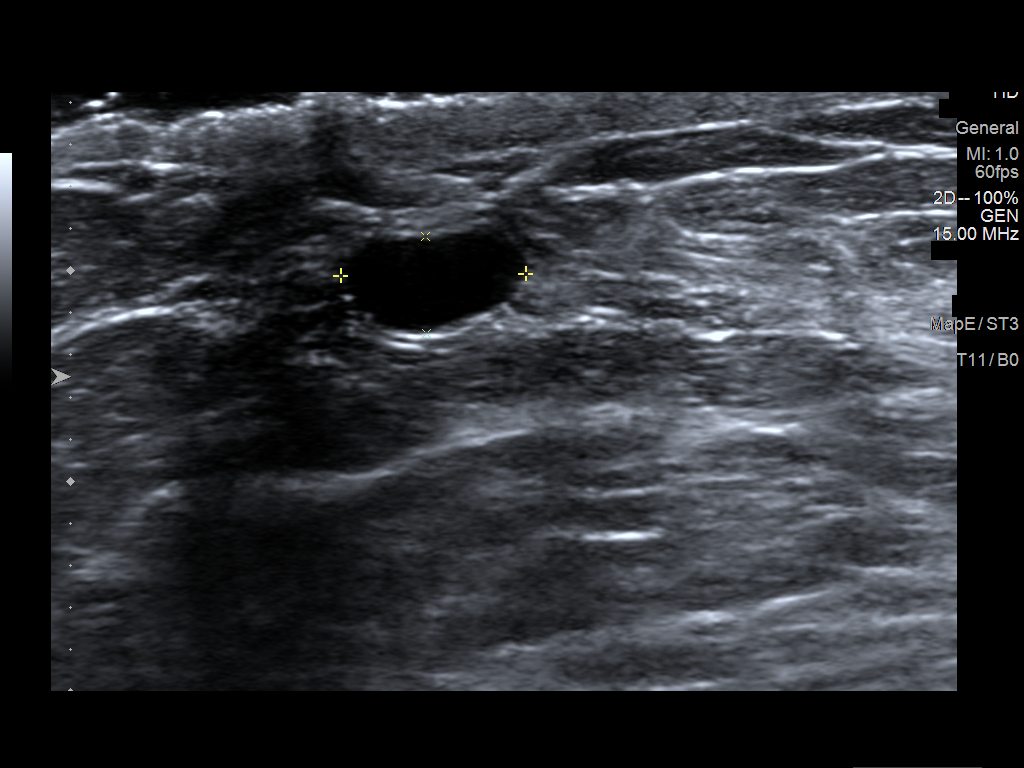
[im 3/5]
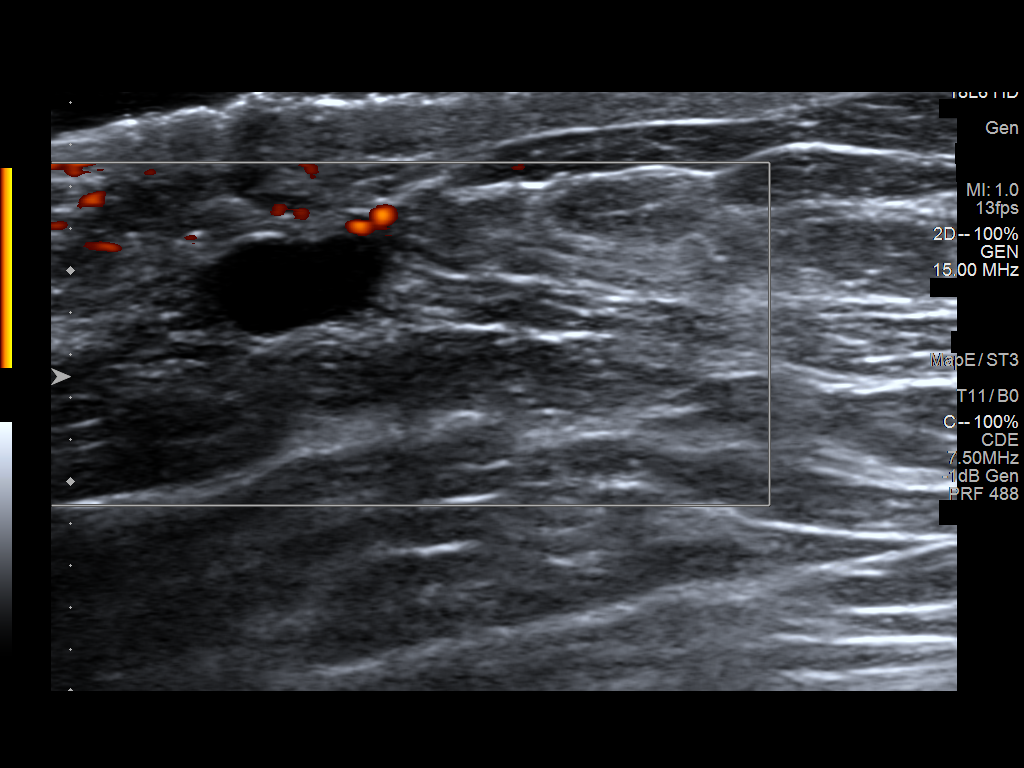
[im 4/5]
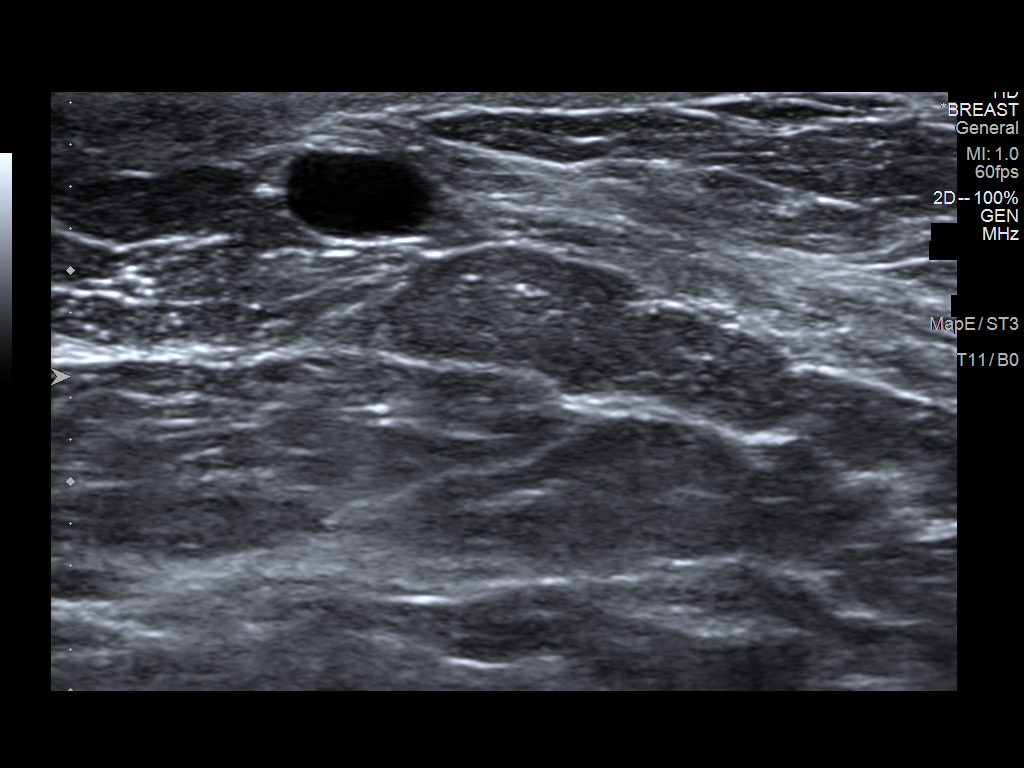
[im 5/5]
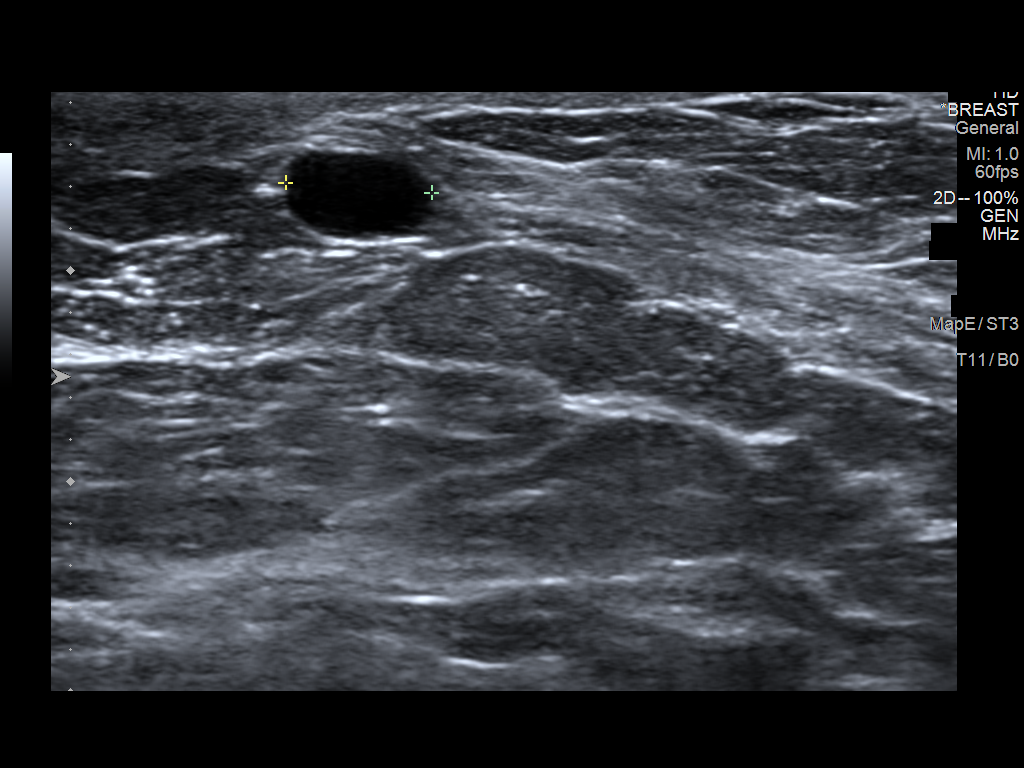

[5 of 5 positions shown; findings below may reference images not displayed]

ACR Breast Density Category b: There are scattered areas of
fibroglandular density.
FINDINGS: Tomosynthesis and synthesized spot-compression CC and MLO views of
the area of concern in the LEFT breast were obtained.

There is a circumscribed oval low-density mass measuring
approximately 1 cm in the subareolar location. There is no
associated architectural distortion or suspicious calcifications.

On correlative physical exam, there is no palpable abnormality in
the subareolar LEFT breast.

Targeted LEFT breast ultrasound is performed, showing an oval
circumscribed parallel benign simple cyst at the 12 o'clock
subareolar location measuring approximately 5 x 9 x 7 mm,
demonstrating posterior acoustic enhancement and no internal power
Doppler flow, corresponding to the screening mammographic finding.
IMPRESSION: 1. No mammographic or sonographic evidence of malignancy involving
the LEFT breast.
2. Benign simple cyst in the subareolar LEFT breast which accounts
for the screening mammographic finding.

RECOMMENDATION:
Screening mammogram in one year.(Code:WB-T-UTX)

I have discussed the findings and recommendations with the patient.
If applicable, a reminder letter will be sent to the patient
regarding the next appointment.

BI-RADS CATEGORY  2: Benign.

## 2019-10-20 NOTE — Progress Notes (Signed)
NEUROLOGY FOLLOW UP OFFICE NOTE  Margaret Webb [id number]  HISTORY OF PRESENT ILLNESS: Margaret D'Ambrosiois a 59 year old female with left-sided hemifacial spasm who follows up for migraines.  UPDATE: Since last visit, nortriptyline was increased to 75mg , however she only took it for one month because there weren't further refills and nobody contacted [place].  They were still stable until two weeks ago when they have increased.  She wakes up at 3 AM with a pounding headache, almost everyday.  Ibuprofen not effective.   She reports increased emotional stress.  Her sister passed away.  She was diagnosed with an ear infection earlier this month and was prescribed a prednisone taper.  She is still having problems with otalgia. Current NSAIDS:ibuprofen Current analgesics:none Current triptans:none Current ergotamine:none Current anti-emetic:none Current muscle relaxants:tizanidine 2mg  at bedtime PRN Current anti-anxiolytic:none Current sleep aide:none Current Antihypertensive medications:none Current Antidepressant medications:nortriptyline 75mg  at bedtime Current Anticonvulsant medications:none Current anti-CGRP:none Current Vitamins/Herbal/Supplements:none Current Antihistamines/Decongestants:Flonase Other therapy:none Hormone/birth control:none Other medications:none  She discontinued Botox due to facial weakness.  The hemifacial spasms have been troublesome.   HISTORY: She has had headaches since her 55s. She describes a severe stabbing pain in the right occipital region. In July 2020, they started to occur every night.Prior to then, they would occur 1 to 2 times a week. She wakes up in the middle of the night (around 12 AM) with the headache and immediately takes acetaminophen-caffeine. Headache gradually resolves by 6 or 7 AM. No associated symptoms such as nausea, vomiting, photophobia, phonophobia, visual disturbance or unilateral  numbness or weakness. No particular trigger. The acetaminophen-caffeine pills no longer seem as effective. She has a constant dull bifrontal headache as well.  She has degenerativediseaseof the cervical spine. She has had multiple imaging. Last MRI of cervical spine from 11/26/16 was personally reviewed and demonstrated degeneration at C4-C5 and C5-C6 causing mild spinal stenosis and moderate foraminal stenosis, as well as mild right foraminal disc bulge at C3-C4.She was previously treated by Dr. Sherwood Gambler and underwent injections. She denies radicular pain, numbness or weakness in the upper extremities.  For 10 years, she has had near-constant twitching of the left eye. For the same amount of time, she also has noted facial asymmetry, specifically mild left lower facial droop. She says sometimes she drools from the left side of her mouth.  MRI of brain without contrast from 11/05/12 was personally reviewed and demonstrated some premature cerebral and cerebellar atrophy but no acute intracranial abnormalities.  She was referred to Dr. Carles Collet for hemifacial spasm.  She has been receiving Botox.  MRI brain w wo and MRA of head performed on 10/17 and 11/18/2018 respectively showed tortuous left vertebral artery in close proximityto left CN VII entry zone noted, as well as 1.6 mm outpouching from left cavernous ICA consistent with infundibulum or meningeal branch aneurysm.  She was seen by Dr. Estanislado Pandy.  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  Findings will be monitored.    Past NSAIDS:Diclofenac 75mg  tablet, Mobic(back and neck pain) Past analgesics:acetaminophen-caffeine Past abortive triptans:none Past abortive ergotamine:none Past muscle relaxants:Baclofen(back and neck pain), Flexeril Past anti-emetic:none Past antihypertensive medications:none Past  antidepressant medications:sertralilne 50mg  Past anticonvulsant medications:Topiramate, gabapentin (back pain) Past anti-CGRP:none Past vitamins/Herbal/Supplements:none Past antihistamines/decongestants:none Other past therapies:Trigger point injections (possible occipital nerve blocks) which were ineffective.  PAST MEDICAL HISTORY: Past Medical History:  Diagnosis Date  . Breast calcification, right 07/2012  .  Dental bridge present    lower  . Dental crowns present   . Headache(784.0)    sinus  . Hypercholesteremia   . Infertility    h/o with second pregnancy  . Seasonal allergies     MEDICATIONS: Current Outpatient Medications on File Prior to Visit  Medication Sig Dispense Refill  . atorvastatin (LIPITOR) 40 MG tablet Take 40 mg by mouth daily.    . calcipotriene-betamethasone (TACLONEX) ointment Apply topically daily.    . fluticasone (FLONASE) 50 MCG/ACT nasal spray Place 1 spray into both nostrils daily as needed for allergies.     Marland Kitchen gabapentin (NEURONTIN) 300 MG capsule Take 1 capsule (300 mg total) by mouth 3 (three) times daily. 90 capsule 3  . ibuprofen (ADVIL) 600 MG tablet Take 600 mg by mouth every 8 (eight) hours as needed.    . nortriptyline (PAMELOR) 75 MG capsule Take 1 capsule (75 mg total) by mouth at bedtime. 30 capsule 0  . tiZANidine (ZANAFLEX) 2 MG tablet Take 1 tablet (2 mg total) by mouth at bedtime as needed for muscle spasms. 30 tablet 3  . zolpidem (AMBIEN) 10 MG tablet Take 10 mg by mouth at bedtime as needed for sleep.      No current facility-administered medications on file prior to visit.    ALLERGIES: Allergies  Allergen Reactions  . Iodine     swelling  . Oxycodone Nausea Only    Pt stated, "it made my stomach upset" and cognitive change    FAMILY HISTORY: Family History  Problem Relation Age of Onset  . Hypertension Mother   . Heart attack Mother 14  . Hypercholesterolemia Father   . Cancer Sister        breast, bone    . Breast cancer Sister   . Diabetes Paternal Grandfather   . Cancer Paternal Grandfather        unknown type  . Heart attack Paternal Grandmother   . Breast cancer Cousin   . Healthy Brother   . Heart attack Brother   . Healthy Daughter     SOCIAL HISTORY: Social History   Socioeconomic History  . Marital status: Married    Spouse name: Not on file  . Number of children: 1  . Years of education: 36  . Highest education level: 12th grade  Occupational History    Employer: [employer]  Tobacco Use  . Smoking status: Never Smoker  . Smokeless tobacco: Never Used  Vaping Use  . Vaping Use: Never used  Substance and Sexual Activity  . Alcohol use: Yes    Alcohol/week: 1.0 standard drink    Types: 1 Standard drinks or equivalent per week    Comment: maybe once a month  . Drug use: No  . Sexual activity: Yes    Partners: Male    Birth control/protection: Other-see comments    Comment: vasectomy  Other Topics Concern  . Not on file  Social History Narrative   Patient is right-handed. She lives with her husband in a one level home. She drinks one cup of coffee a day, and 2 glasses of tea a week, other times water. She exercises 3-4 x a week.    Social Determinants of Health   Financial Resource Strain:   . Difficulty of Paying Living Expenses: Not on file  Food Insecurity:   . Worried About Charity fundraiser in the Last Year: Not on file  . Ran Out of Food in the Last Year: Not on  file  Transportation Needs:   . Film/video editor (Medical): Not on file  . Lack of Transportation (Non-Medical): Not on file  Physical Activity:   . Days of Exercise per Week: Not on file  . Minutes of Exercise per Session: Not on file  Stress:   . Feeling of Stress : Not on file  Social Connections:   . Frequency of Communication with Friends and Family: Not on file  . Frequency of Social Gatherings with Friends and Family: Not on file  . Attends Religious Services: Not on  file  . Active Member of Clubs or Organizations: Not on file  . Attends Archivist Meetings: Not on file  . Marital Status: Not on file  Intimate Partner Violence:   . Fear of Current or Ex-Partner: Not on file  . Emotionally Abused: Not on file  . Physically Abused: Not on file  . Sexually Abused: Not on file    PHYSICAL EXAM: Blood pressure 132/88, pulse 92, height 5\' 5"  (1.651 m), weight 195 lb 6.4 oz (88.6 kg), last menstrual period 10/28/2006, SpO2 99 %. General: No acute distress.  Patient appears well-groomed.   Head:  Normocephalic/atraumatic Eyes:  Fundi examined but not visualized Neck: supple, no paraspinal tenderness, full range of motion Heart:  Regular rate and rhythm Lungs:  Clear to auscultation bilaterally Back: No paraspinal tenderness Neurological Exam: alert and oriented to person, place, and time. Attention span and concentration intact, recent and remote memory intact, fund of knowledge intact.  Speech fluent and not dysarthric, language intact.  CN II-XII intact. Bulk and tone normal, muscle strength 5/5 throughout.  Sensation to light touch, temperature and vibration intact.  Deep tendon reflexes 2+ throughout, toes downgoing.  Finger to nose and heel to shin testing intact.  Gait normal, Romberg negative.  IMPRESSION: 1.  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  I would not prescribe another prednisone taper as she was just recently on one. 2.  Left-sided hemifacial spasm.   3.  Cervical spondylosis  PLAN: 1.  For preventative management, increase nortriptyline to 100mg  at bedtime.  Advised to contact me in 6 weeks with update for any potential adjustments/changes. 2.  For abortive therapy, may now use Excedrin but limit use of pain relievers to no more than 2 days out of week to prevent risk of rebound or medication-overuse headache. 3. For  hemifacial spasm and neck pain, may take tizanidine 2mg  up to 3 times daily as needed.  Cautioned for drowsiness. 4.  Keep headache diary 5.  Exercise, hydration, caffeine cessation, sleep hygiene, monitor for and avoid triggers 6.  Consider:  magnesium citrate 400mg  daily, riboflavin 400mg  daily, and coenzyme Q10 100mg  three times daily 7. Always keep in mind that currently taking a hormone or birth control may be a possible trigger or aggravating factor for migraine. 8. Follow up 4 to 6 months   Metta Clines, DO  CC: Bing Matter, PA-C

## 2019-10-24 ENCOUNTER — Ambulatory Visit (INDEPENDENT_AMBULATORY_CARE_PROVIDER_SITE_OTHER): Payer: BC Managed Care – PPO | Admitting: Neurology

## 2019-10-24 ENCOUNTER — Other Ambulatory Visit: Payer: Self-pay

## 2019-10-24 ENCOUNTER — Encounter: Payer: Self-pay | Admitting: Neurology

## 2019-10-24 VITALS — BP 132/88 | HR 92 | Ht 65.0 in | Wt 195.4 lb

## 2019-10-24 DIAGNOSIS — G43019 Migraine without aura, intractable, without status migrainosus: Secondary | ICD-10-CM | POA: Diagnosis not present

## 2019-10-24 DIAGNOSIS — M47812 Spondylosis without myelopathy or radiculopathy, cervical region: Secondary | ICD-10-CM | POA: Diagnosis not present

## 2019-10-24 DIAGNOSIS — G5132 Clonic hemifacial spasm, left: Secondary | ICD-10-CM | POA: Diagnosis not present

## 2019-10-24 MED ORDER — NORTRIPTYLINE HCL 50 MG PO CAPS: 100.0000 mg | ORAL_CAPSULE | Freq: Every day | ORAL | 1 refills | Status: DC

## 2019-10-24 MED ORDER — TIZANIDINE HCL 2 MG PO TABS: ORAL_TABLET | ORAL | 1 refills | Status: DC

## 2019-10-24 NOTE — Patient Instructions (Signed)
1.  Increase nortriptyline to 100mg  at bedtime.  Contact me in 6 weeks with update. 2.  May take tizanidine 2mg  up to three times daily as needed for neck pain and facial spasms 3.  May use Excedrin but limit to no more than 2 days out of week to prevent rebound headache 4.  Follow up in 4 to 6 months

## 2019-11-01 ENCOUNTER — Ambulatory Visit
Admission: RE | Admit: 2019-11-01 | Discharge: 2019-11-01 | Disposition: A | Discharge status: Home / Self Care | Payer: BC Managed Care – PPO | Source: Ambulatory Visit

## 2019-11-01 ENCOUNTER — Other Ambulatory Visit: Payer: Self-pay

## 2019-11-01 DIAGNOSIS — Z1231 Encounter for screening mammogram for malignant neoplasm of breast: Secondary | ICD-10-CM

## 2020-02-29 ENCOUNTER — Encounter: Payer: Self-pay | Admitting: Neurology

## 2020-02-29 NOTE — Progress Notes (Addendum)
2/2- patient left me a msg that she wanted to start back doing the Botox treatments for her spams in her face.   Submitted BV; verified that all her insurance info was the same as last year [employer]. Instructed patient I would call her back once approved to schedule appt.   2/18- PA forms faxed to [place]. PA is required per BV. [facility] is the SP.

## 2020-03-08 ENCOUNTER — Ambulatory Visit: Payer: BC Managed Care – PPO | Admitting: Neurology

## 2020-03-20 NOTE — Progress Notes (Signed)
PA Approval received- Ref #: [id number] approved for 4 visits valid until 03/18/21.

## 2020-03-20 NOTE — Progress Notes (Signed)
2/22- called [facility] and set up her account/ gave verbal for the 100 units botox. PA already on file. Appt scheduld 4/8.

## 2020-03-30 ENCOUNTER — Other Ambulatory Visit: Payer: Self-pay

## 2020-03-30 ENCOUNTER — Ambulatory Visit (INDEPENDENT_AMBULATORY_CARE_PROVIDER_SITE_OTHER): Payer: BC Managed Care – PPO | Admitting: Neurology

## 2020-03-30 DIAGNOSIS — G5132 Clonic hemifacial spasm, left: Secondary | ICD-10-CM | POA: Diagnosis not present

## 2020-03-30 MED ORDER — ONABOTULINUMTOXINA 100 UNITS IJ SOLR
100.0000 [IU] | Freq: Once | INTRAMUSCULAR | Status: AC
  Administered 2020-03-30: 100 [IU] via INTRAMUSCULAR

## 2020-03-30 NOTE — Procedures (Signed)
[facility]   History:  Diagnosis: Hemifacial spasm   Initial side: left  Result History  Onset of effect: helped  Duration of Benefit: Has not had injections for over a year.  Patient had decided to hold on the injections, hoping that things would not get worse.  However, she reports that the facial spasm has gotten much worse and she decided to repeat the injections.  Consent obtained from: The patient Benefits discussed included, but were not limited to decreased muscle tightness, increased joint range of motion, and decreased pain.  Risk discussed included, but were not limited pain and discomfort, bleeding, bruising, excessive weakness, venous thrombosis, muscle atrophy and dysphagia.  A copy of the patient medication guide was given to the patient which explains the blackbox warning.  Patients identity and treatment sites confirmed Yes.  .  Details of Procedure: Skin was cleaned with alcohol.  A 30 gauge, 1/2 inch needle was introduced to the target muscle.  Prior to injection, the needle plunger was aspirated to make sure the needle was not within a blood vessel.  There was no blood retrieved on aspiration.    Following is a summary of the muscles injected  And the amount of Botulinum toxin used:  Injections  Location Left  Right Units Number of sites        Corrugator      Frontalis      Lower Lid, Lateral 2.5  2.5 1  Lower Lid Medial 2.5  2.5 1  Upper Lid, Lateral 2.5  2.5 1  Upper Lid, Medial      Canthus 5.0  5.0 1  Temporalis      Nasalis 2.5  2.5 1  Procerus      Zygomaticus Major      TOTAL UNITS:   15    Agent: Botulinum Type A ( Onobotulinum Toxin type A ).  1 vials of Botox were used, each containing 50 units and freshly diluted with 2 mL of sterile, non-perserved saline   Total injected (Units):15  Total wasted (Units): 85 Pt tolerated procedure well without complications.   Reinjection is anticipated in 3 months.

## 2020-04-03 ENCOUNTER — Telehealth (HOSPITAL_COMMUNITY): Payer: Self-pay

## 2020-04-03 NOTE — Telephone Encounter (Signed)
Called to schedule diagnostic angiogram, no answer, left vm. AW  

## 2020-04-06 ENCOUNTER — Other Ambulatory Visit: Payer: Self-pay

## 2020-04-06 MED ORDER — AJOVY 225 MG/1.5ML ~~LOC~~ SOAJ: 225.0000 mg | SUBCUTANEOUS | 5 refills | Status: DC

## 2020-04-06 NOTE — Progress Notes (Signed)
Per Dr.Jaffe Ajovy sent to the pharmacy for the pt.

## 2020-04-09 ENCOUNTER — Encounter: Payer: Self-pay | Admitting: Neurology

## 2020-04-09 NOTE — Progress Notes (Signed)
Christianna D'AMBROSIO KeyAurelio Brash - Rx #: [id number] Need help? Call us at [phone] Outcome Approvedtoday Effective from 04/09/2020 through 07/01/2020. Drug AJOVY (fremanezumab-vfrm) injection 225MG /1.5ML auto-injectors Form [employer] control surveyor Form (CB) Original Claim Info 75

## 2020-05-04 ENCOUNTER — Ambulatory Visit: Payer: BC Managed Care – PPO | Admitting: Neurology

## 2020-05-11 ENCOUNTER — Other Ambulatory Visit: Payer: Self-pay | Admitting: Neurology

## 2020-06-22 ENCOUNTER — Telehealth: Payer: Self-pay | Admitting: Neurology

## 2020-06-22 NOTE — Telephone Encounter (Signed)
Pt botox scheduled to be delivered June 1st

## 2020-06-29 ENCOUNTER — Ambulatory Visit (INDEPENDENT_AMBULATORY_CARE_PROVIDER_SITE_OTHER): Payer: BC Managed Care – PPO | Admitting: Neurology

## 2020-06-29 ENCOUNTER — Other Ambulatory Visit: Payer: Self-pay

## 2020-06-29 ENCOUNTER — Telehealth: Payer: Self-pay

## 2020-06-29 DIAGNOSIS — G5132 Clonic hemifacial spasm, left: Secondary | ICD-10-CM | POA: Diagnosis not present

## 2020-06-29 DIAGNOSIS — G43709 Chronic migraine without aura, not intractable, without status migrainosus: Secondary | ICD-10-CM

## 2020-06-29 MED ORDER — QULIPTA 60 MG PO TABS: 60.0000 mg | ORAL_TABLET | Freq: Every day | ORAL | 5 refills | Status: DC

## 2020-06-29 MED ORDER — NORTRIPTYLINE HCL 50 MG PO CAPS: 50.0000 mg | ORAL_CAPSULE | Freq: Every day | ORAL | 0 refills | Status: DC

## 2020-06-29 MED ORDER — ONABOTULINUMTOXINA 100 UNITS IJ SOLR
100.0000 [IU] | Freq: Once | INTRAMUSCULAR | Status: AC
  Administered 2020-06-29: 100 [IU] via INTRAMUSCULAR

## 2020-06-29 NOTE — Telephone Encounter (Signed)
Telephone call to pt, per pt she  tried to use the Ajovy and it did not work. She ask the [facility] for a replacement. Per pt they sent over a fax to have the relpacement sent.  So she never tried he ajovy she did not want to try something that will have a defect or not work for her.    Advise pt we never got the request. Is there something else you would like to try?  Per pt if that is okay with Dr.Jaffe she could have something else called in she can not wait til her f/u appt in Aug.

## 2020-06-29 NOTE — Telephone Encounter (Signed)
-----   Message from Pieter Partridge, DO sent at 06/29/2020  8:49 AM EDT ----- Is she taking the Ajovy?  Is there a problem? ----- Message ----- From: Ludwig Clarks, DO Sent: 06/29/2020   8:42 AM EDT To: Pieter Partridge, DO  I did a modified migraine protocol with the botox I had left.  She said she called b/c she had some issue with the Ajovy but no one called her back??  Just fyi

## 2020-06-29 NOTE — Telephone Encounter (Signed)
Pt advised.

## 2020-06-29 NOTE — Procedures (Signed)
[facility]   History:  Diagnosis: Hemifacial spasm   Initial side: left  Result History  Onset of effect: helped  Duration of Benefit: Has not had injections for over a year.  Patient had decided to hold on the injections, hoping that things would not get worse.  However, she reports that the facial spasm has gotten much worse and she decided to repeat the injections.  Consent obtained from: The patient Benefits discussed included, but were not limited to decreased muscle tightness, increased joint range of motion, and decreased pain.  Risk discussed included, but were not limited pain and discomfort, bleeding, bruising, excessive weakness, venous thrombosis, muscle atrophy and dysphagia.  A copy of the patient medication guide was given to the patient which explains the blackbox warning.  Patients identity and treatment sites confirmed Yes.  .  Details of Procedure: Skin was cleaned with alcohol.  A 30 gauge, 1/2 inch needle was introduced to the target muscle.  Prior to injection, the needle plunger was aspirated to make sure the needle was not within a blood vessel.  There was no blood retrieved on aspiration.    Following is a summary of the muscles injected  And the amount of Botulinum toxin used:  Injections  Location Left  Right Units Number of sites        Corrugator      Frontalis      Lower Lid, Lateral 2.5  2.5 1  Lower Lid Medial 2.5  2.5 1  Upper Lid, Lateral 2.5  2.5 1  Upper Lid, Medial      Canthus 5.0  5.0 1  Temporalis      Nasalis 2.5  2.5 1  Procerus      Zygomaticus Major      TOTAL UNITS:   15    Agent: Botulinum Type A ( Onobotulinum Toxin type A ).  1 vials of Botox were used, each containing 50 units and freshly diluted with 2 mL of sterile, non-perserved saline   Total injected (Units):15 Pt tolerated procedure well without complications.   Reinjection is anticipated in 3 months.  [facility]   History:  Diagnosis: chronic  migraine  Initial side: bilateral    Consent obtained from: The patient Benefits discussed included, but were not limited to decreased muscle tightness, increased joint range of motion, and decreased pain.  Risk discussed included, but were not limited pain and discomfort, bleeding, bruising, excessive weakness, venous thrombosis, muscle atrophy and dysphagia.  A copy of the patient medication guide was given to the patient which explains the blackbox warning.  Patients identity and treatment sites confirmed Yes.  .  Following is a summary of the muscles injected  And the amount of Botulinum toxin used:  Injections  Location Left  Right Units Number of sites        Corrugator 5.0 5.0 10 1 each side  Frontalis 2.5/2.5 2.5/2.5 10 2  each  procerus   5 1  temporalis 5.0 x 4 5.0 x 4 40 4 each  occipitalis 5.0x 2 5.0x 2 20 2  each                                      TOTAL UNITS:   85     Agent: Botulinum Type A ( Onobotulinum Toxin type A ).  100 unit vial of Botox used, each containing 50 units and freshly diluted with  2 mL of sterile, non-perserved saline   Total injected (Units): 85  Total wasted (Units): none wasted (used above for hemifacial spasm) Pt tolerated procedure well without complications.   Reinjection is anticipated in 3 months.

## 2020-06-29 NOTE — Telephone Encounter (Signed)
If she wishes to avoid an injection, please send prescription for Qulipta 60mg  daily

## 2020-06-29 NOTE — Telephone Encounter (Signed)
Per pt she taking the Nortriptyline 100 mg daily  and feels that it is not helping.   Pt wanted to know if she take the Quilpta instead of the nortriptyline  With  A rescue medication or both together?  Please advise.

## 2020-06-29 NOTE — Telephone Encounter (Signed)
If nortriptyline has not helped at all, then please send a prescription for nortriptyline 50mg  at bedtime for one week, then stop nortriptyline.

## 2020-07-04 NOTE — Progress Notes (Signed)
[facility] Master Key: [id number] - Rx #: [id number] Need help? Call us at [phone] Status Additional Information Required Drug Qulipta 60MG  tablets Form [employer] [place] Commercial Electronic Request Form (CB) Original Claim Info 75 sent to plan awaiting approval.

## 2020-07-13 ENCOUNTER — Telehealth: Payer: Self-pay | Admitting: Neurology

## 2020-07-13 NOTE — Telephone Encounter (Signed)
Qulipta 60 MG Received letter  07/12/2020 from [facility]

## 2020-07-16 ENCOUNTER — Other Ambulatory Visit: Payer: Self-pay

## 2020-07-16 MED ORDER — QULIPTA 60 MG PO TABS: 60.0000 mg | ORAL_TABLET | Freq: Every day | ORAL | 5 refills | Status: DC

## 2020-07-16 NOTE — Progress Notes (Signed)
Qulipta   Status: PA Response - Denied  Created: June 3rd, 2022 [id number]  Sent: June 8th, 2022   Per Dr.Jaffe send script to [facility].

## 2020-07-16 NOTE — Progress Notes (Signed)
Status: PA Response - Denied  Created: June 3rd, 2022 [id number]  Sent: June 8th, 2022

## 2020-07-17 ENCOUNTER — Telehealth: Payer: Self-pay | Admitting: Neurology

## 2020-07-17 NOTE — Telephone Encounter (Signed)
Submitted add'l info need through [facility] website for Qulipta  60 mg

## 2020-08-06 ENCOUNTER — Other Ambulatory Visit: Payer: Self-pay

## 2020-08-06 ENCOUNTER — Ambulatory Visit (INDEPENDENT_AMBULATORY_CARE_PROVIDER_SITE_OTHER): Payer: BC Managed Care – PPO | Admitting: Obstetrics & Gynecology

## 2020-08-06 ENCOUNTER — Encounter (HOSPITAL_BASED_OUTPATIENT_CLINIC_OR_DEPARTMENT_OTHER): Payer: Self-pay | Admitting: Obstetrics & Gynecology

## 2020-08-06 VITALS — BP 138/94 | HR 62 | Ht 63.75 in | Wt 193.0 lb

## 2020-08-06 DIAGNOSIS — Z01419 Encounter for gynecological examination (general) (routine) without abnormal findings: Secondary | ICD-10-CM | POA: Diagnosis not present

## 2020-08-06 DIAGNOSIS — F4321 Adjustment disorder with depressed mood: Secondary | ICD-10-CM | POA: Diagnosis not present

## 2020-08-06 DIAGNOSIS — R350 Frequency of micturition: Secondary | ICD-10-CM | POA: Diagnosis not present

## 2020-08-06 DIAGNOSIS — Z803 Family history of malignant neoplasm of breast: Secondary | ICD-10-CM

## 2020-08-06 DIAGNOSIS — Z1231 Encounter for screening mammogram for malignant neoplasm of breast: Secondary | ICD-10-CM

## 2020-08-06 DIAGNOSIS — Z780 Asymptomatic menopausal state: Secondary | ICD-10-CM

## 2020-08-06 LAB — POCT URINALYSIS DIPSTICK
Appearance: NORMAL
Bilirubin, UA: NEGATIVE
Glucose, UA: NEGATIVE
Ketones, UA: NEGATIVE
Leukocytes, UA: NEGATIVE
Nitrite, UA: NEGATIVE
Protein, UA: NEGATIVE
Spec Grav, UA: <=1.005 — AB (ref 1.010–1.025)
Urobilinogen, UA: 0.2 E.U./dL
pH, UA: 6.5 (ref 5.0–8.0)

## 2020-08-06 MED ORDER — ESCITALOPRAM OXALATE 10 MG PO TABS: 10.0000 mg | ORAL_TABLET | Freq: Every day | ORAL | 5 refills | Status: DC

## 2020-08-06 NOTE — Progress Notes (Signed)
69 G2P1 MWF here for AEX.  Doing well.  Denies vaginal bleeding.  Daughter lives in [place].  Has a 20 month old son.  She is a pregnant again and with a girl.  Mrs. D'Ambrosio and her husband have bought a home in [place].    Denies vaginal bleeding.  Sister passed last year.  Feels like she is suffering from depression.    Still having headaches.  Seeing Dr. Tomi Likens, neurology.  Patient's last menstrual period was 10/28/2006 (approximate).          Sexually active: Yes.    The current method of family planning is post menopausal status.    Exercising: yes, [place] Smoker:  no  Health Maintenance: Pap:  08/04/2019, neg pap with neg HR HPV History of abnormal Pap:  no MMG:  11/01/2019 Colonoscopy:  10/31/2016, Dr. Collene Mares, Follow up 10 years BMD:   order placed TDaP:  2016 Shingrix:   discussed Hep C testing: 2018 Screening Labs: does with Emmie Niemann   reports that she has never smoked. She has never used smokeless tobacco. She reports current alcohol use of about 1.0 standard drink of alcohol per week. She reports that she does not use drugs.  Past Medical History:  Diagnosis Date   Breast calcification, right 07/2012   Dental bridge present    lower   Dental crowns present    Headache(784.0)    sinus   Hypercholesteremia    Infertility    h/o with second pregnancy   Seasonal allergies     Past Surgical History:  Procedure Laterality Date   BREAST BIOPSY Right 08/09/2012   Procedure: BREAST BIOPSY WITH NEEDLE LOCALIZATION;  Surgeon: Haywood Lasso, MD;  Location: [place];  Service: General;  Laterality: Right;  needle localization at breast center of [place] TOE Left 11/2017   with bunionectomy   FOOT SURGERY Right 06/08/2018   HYSTEROSCOPY W/ ENDOMETRIAL ABLATION  11/09/2006   IR 3D INDEPENDENT WKST  12/03/2018   IR ANGIO INTRA EXTRACRAN SEL COM CAROTID INNOMINATE BILAT MOD SED  12/03/2018   IR ANGIO  VERTEBRAL SEL SUBCLAVIAN INNOMINATE UNI L MOD SED  12/03/2018   IR ANGIO VERTEBRAL SEL VERTEBRAL UNI R MOD SED  12/03/2018   IR US GUIDE VASC ACCESS RIGHT  12/03/2018    Current Outpatient Medications  Medication Sig Dispense Refill   Atogepant (QULIPTA) 60 MG TABS Take 60 mg by mouth daily. 30 tablet 5   atorvastatin (LIPITOR) 40 MG tablet Take 40 mg by mouth daily.     calcipotriene-betamethasone (TACLONEX) ointment Apply topically daily.     fluticasone (FLONASE) 50 MCG/ACT nasal spray Place 1 spray into both nostrils daily as needed for allergies.      ibuprofen (ADVIL) 600 MG tablet Take 600 mg by mouth every 8 (eight) hours as needed.     tiZANidine (ZANAFLEX) 2 MG tablet Take 1 tablet up to three times daily as needed.  Caution for drowsiness. 270 tablet 1   zolpidem (AMBIEN) 10 MG tablet Take 10 mg by mouth at bedtime as needed for sleep.      Fremanezumab-vfrm (AJOVY) 225 MG/1.5ML SOAJ Inject 225 mg into the skin every 28 (twenty-eight) days. (Patient not taking: Reported on 08/06/2020) 1.68 mL 5   gabapentin (NEURONTIN) 300 MG capsule Take 1 capsule (300 mg total) by mouth 3 (three) times daily. (Patient not taking: No sig reported) 90 capsule 3  No current facility-administered medications for this visit.    Family History  Problem Relation Age of Onset   Hypertension Mother    Heart attack Mother 53   Hypercholesterolemia Father    Cancer Sister        breast, bone   Breast cancer Sister    Diabetes Paternal Grandfather    Cancer Paternal Grandfather        unknown type   Heart attack Paternal Grandmother    Breast cancer Cousin    Healthy Brother    Heart attack Brother    Healthy Daughter     Review of Systems  Psychiatric/Behavioral:  Positive for dysphoric mood.   All other systems reviewed and are negative.  Exam:   BP (!) 138/94   Pulse 62   Ht 5' 3.75" (1.619 m)   Wt 193 lb (87.5 kg)   LMP 10/28/2006 (Approximate)   BMI 33.39 kg/m   Height: 5' 3.75"  (161.9 cm)  General appearance: alert, cooperative and appears stated age Head: Normocephalic, without obvious abnormality, atraumatic Neck: no adenopathy, supple, symmetrical, trachea midline and thyroid normal to inspection and palpation Lungs: clear to auscultation bilaterally Breasts: normal appearance, no masses or tenderness Heart: regular rate and rhythm Abdomen: soft, non-tender; bowel sounds normal; no masses,  no organomegaly Extremities: extremities normal, atraumatic, no cyanosis or edema Skin: Skin color, texture, turgor normal. No rashes or lesions Lymph nodes: Cervical, supraclavicular, and axillary nodes normal. No abnormal inguinal nodes palpated Neurologic: Grossly normal   Pelvic: External genitalia:  no lesions              Urethra:  normal appearing urethra with no masses, tenderness or lesions              Bartholins and Skenes: normal                 Vagina: normal appearing vagina with normal color and no discharge, no lesions              Cervix: no lesions              Pap taken: No. Bimanual Exam:  Uterus:  normal size, contour, position, consistency, mobility, non-tender              Adnexa: normal adnexa and no mass, fullness, tenderness               Rectovaginal: Confirms               Anus:  normal sphincter tone, no lesions  Chaperone, Octaviano Batty, CMA, was present for exam.  Assessment/Plan: 1. Well woman exam with routine gynecological exam - pap neg 08/04/2019 with neg HR HPV.  Not indicated today. - MMG 11/01/2019 - colonoscopy 10/31/2016, follow up 10 years - BMD order placed - vaccines updated - screening lab work done with Emmie Niemann  2. Postmenopausal - no HRT - DG BONE DENSITY (DXA); Future  3. Grief reaction - counseling suggested - will start Lexapro 10mg  daily.  She will take 1/2 tab daily for 4-6 days to decrease nausea.  Side effects discussed.  Pt will give update in about 3 weeks.  She desires to communicate via mychart and  not have follow up appt at this time  4. Family history of breast cancer - [employer] C[employer]ick model done last year with ~14% lifetime risk of breast cancer.  Screening breast MRI discussed last year and today.  Pt declines for now. - sister did have  negative genetic testing

## 2020-08-27 ENCOUNTER — Encounter (HOSPITAL_BASED_OUTPATIENT_CLINIC_OR_DEPARTMENT_OTHER): Payer: Self-pay

## 2020-08-28 ENCOUNTER — Ambulatory Visit: Payer: BC Managed Care – PPO | Admitting: Neurology

## 2020-09-04 ENCOUNTER — Telehealth: Payer: Self-pay | Admitting: Neurology

## 2020-09-04 NOTE — Telephone Encounter (Signed)
Received Access Nurse message from [place] with [employer] . Called to follow up on the status of a pre-auth for [employer]

## 2020-09-04 NOTE — Telephone Encounter (Signed)
Advised rep at [facility] denied 07/04/20. No attempt at Appeal.

## 2020-09-27 ENCOUNTER — Ambulatory Visit: Payer: BC Managed Care – PPO

## 2020-11-02 ENCOUNTER — Ambulatory Visit: Payer: BC Managed Care – PPO | Admitting: Neurology

## 2020-11-08 ENCOUNTER — Telehealth: Payer: Self-pay | Admitting: *Deleted

## 2020-11-08 NOTE — Telephone Encounter (Signed)
Called [facility] the Botox prescription benefit is being verified by their [employer] de[facility]. They said call back Tuesday (10/18) to see if it is ready to be delivered. Patient has already given consent.

## 2020-11-14 ENCOUNTER — Telehealth: Payer: Self-pay

## 2020-11-14 ENCOUNTER — Ambulatory Visit (HOSPITAL_BASED_OUTPATIENT_CLINIC_OR_DEPARTMENT_OTHER)
Admission: RE | Admit: 2020-11-14 | Discharge: 2020-11-14 | Disposition: A | Discharge status: Home / Self Care | Payer: BC Managed Care – PPO | Source: Ambulatory Visit | Attending: Obstetrics & Gynecology | Admitting: Obstetrics & Gynecology

## 2020-11-14 ENCOUNTER — Other Ambulatory Visit: Payer: Self-pay

## 2020-11-14 DIAGNOSIS — Z803 Family history of malignant neoplasm of breast: Secondary | ICD-10-CM | POA: Diagnosis present

## 2020-11-14 DIAGNOSIS — Z780 Asymptomatic menopausal state: Secondary | ICD-10-CM

## 2020-11-14 DIAGNOSIS — Z1231 Encounter for screening mammogram for malignant neoplasm of breast: Secondary | ICD-10-CM | POA: Diagnosis present

## 2020-11-14 NOTE — Telephone Encounter (Signed)
New message   Call  [facility] spoke with [facility] consent by patient given  release order within an hour  advised to call back on delivery date

## 2020-11-14 NOTE — Telephone Encounter (Signed)
F/u   Call Chical back on Botox delivery date is expected to arrive on Thursday, November 15, 2020.

## 2020-11-16 ENCOUNTER — Encounter (HOSPITAL_BASED_OUTPATIENT_CLINIC_OR_DEPARTMENT_OTHER): Payer: Self-pay | Admitting: *Deleted

## 2020-11-16 ENCOUNTER — Emergency Department (HOSPITAL_BASED_OUTPATIENT_CLINIC_OR_DEPARTMENT_OTHER)
Admission: EM | Admit: 2020-11-16 | Discharge: 2020-11-16 | Disposition: A | Discharge status: Home / Self Care | Payer: BC Managed Care – PPO | Attending: Emergency Medicine | Admitting: Emergency Medicine

## 2020-11-16 ENCOUNTER — Other Ambulatory Visit: Payer: Self-pay

## 2020-11-16 ENCOUNTER — Ambulatory Visit (INDEPENDENT_AMBULATORY_CARE_PROVIDER_SITE_OTHER): Payer: BC Managed Care – PPO | Admitting: Neurology

## 2020-11-16 ENCOUNTER — Emergency Department (HOSPITAL_BASED_OUTPATIENT_CLINIC_OR_DEPARTMENT_OTHER): Payer: BC Managed Care – PPO | Admitting: Radiology

## 2020-11-16 DIAGNOSIS — S4391XA Sprain of unspecified parts of right shoulder girdle, initial encounter: Secondary | ICD-10-CM | POA: Insufficient documentation

## 2020-11-16 DIAGNOSIS — S20211A Contusion of right front wall of thorax, initial encounter: Secondary | ICD-10-CM | POA: Diagnosis not present

## 2020-11-16 DIAGNOSIS — S43401A Unspecified sprain of right shoulder joint, initial encounter: Secondary | ICD-10-CM

## 2020-11-16 DIAGNOSIS — G43709 Chronic migraine without aura, not intractable, without status migrainosus: Secondary | ICD-10-CM | POA: Diagnosis not present

## 2020-11-16 DIAGNOSIS — W108XXA Fall (on) (from) other stairs and steps, initial encounter: Secondary | ICD-10-CM | POA: Insufficient documentation

## 2020-11-16 DIAGNOSIS — M25519 Pain in unspecified shoulder: Secondary | ICD-10-CM | POA: Diagnosis present

## 2020-11-16 DIAGNOSIS — G5132 Clonic hemifacial spasm, left: Secondary | ICD-10-CM

## 2020-11-16 DIAGNOSIS — Y9389 Activity, other specified: Secondary | ICD-10-CM | POA: Insufficient documentation

## 2020-11-16 MED ORDER — ETODOLAC 300 MG PO CAPS: 300.0000 mg | ORAL_CAPSULE | Freq: Three times a day (TID) | ORAL | 0 refills | Status: DC

## 2020-11-16 MED ORDER — ONABOTULINUMTOXINA 100 UNITS IJ SOLR
100.0000 [IU] | Freq: Once | INTRAMUSCULAR | Status: AC
  Administered 2020-11-16: 100 [IU] via INTRAMUSCULAR

## 2020-11-16 NOTE — ED Provider Notes (Signed)
[facility] EMERGENCY DEPT Provider Note   CSN: [id number] Arrival date & time: 11/16/20  0900     History Chief Complaint  Patient presents with   Fall   Shoulder Injury    Margaret Webb is a 60 y.o. female.   Fall  Shoulder Injury   Patient presents ED for evaluation of shoulder and upper back pain.  Patient was walking this morning which she accidentally stumbled and fell down a couple steps.  Patient is now having pain in her right shoulder area as well as below her right scapula.  Does hurt when she takes a deep breath.  Pain also increases whenever she tries to move her right arm she did not hit her head or lose consciousness.  She is not having any abdominal pain.  No numbness or weakness.  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   Pain in left knee 09/07/2018   Pain of left hip joint 09/07/2018   Trochanteric bursitis of left hip 09/07/2018   Insomnia 07/08/2017   Accelerated hypertension 02/23/2015   Hyperlipidemia 02/23/2015    Past Surgical History:  Procedure Laterality Date   BREAST BIOPSY Right 08/09/2012   Procedure: BREAST BIOPSY WITH NEEDLE LOCALIZATION;  Surgeon: Haywood Lasso, MD;  Location: [place];  Service: General;  Laterality: Right;  needle localization at breast center of [place] TOE Left 11/2017   with bunionectomy   FOOT SURGERY Right 06/08/2018   HYSTEROSCOPY W/ ENDOMETRIAL ABLATION  11/09/2006   IR 3D INDEPENDENT WKST  12/03/2018   IR ANGIO INTRA EXTRACRAN SEL COM CAROTID INNOMINATE BILAT MOD SED  12/03/2018   IR  ANGIO VERTEBRAL SEL SUBCLAVIAN INNOMINATE UNI L MOD SED  12/03/2018   IR ANGIO VERTEBRAL SEL VERTEBRAL UNI R MOD SED  12/03/2018   IR US GUIDE VASC ACCESS RIGHT  12/03/2018     OB History     Gravida  2   Para  1   Term      Preterm      AB      Living  1      SAB      IAB      Ectopic      Multiple      Live Births              Family History  Problem Relation Age of Onset   Hypertension Mother    Heart attack Mother 76   Hypercholesterolemia Father    Cancer Sister        breast, bone   Breast cancer Sister    Diabetes Paternal Grandfather    Cancer Paternal Grandfather        unknown type   Heart attack Paternal Grandmother    Breast cancer Cousin    Healthy Brother    Heart attack Brother    Healthy Daughter     Social History   Tobacco Use   Smoking status: Never   Smokeless tobacco: Never  Vaping Use  Vaping Use: Never used  Substance Use Topics   Alcohol use: Yes    Alcohol/week: 1.0 standard drink    Types: 1 Standard drinks or equivalent per week    Comment: maybe once a month   Drug use: No    Home Medications Prior to Admission medications   Medication Sig Start Date End Date Taking? Authorizing Provider  etodolac (LODINE) 300 MG capsule Take 1 capsule (300 mg total) by mouth every 8 (eight) hours. 11/16/20  Yes Dorie Rank, MD  Atogepant (QULIPTA) 60 MG TABS Take 60 mg by mouth daily. 07/16/20   Pieter Partridge, DO  atorvastatin (LIPITOR) 40 MG tablet Take 40 mg by mouth daily. 03/03/18   [provider]  calcipotriene-betamethasone (TACLONEX) ointment Apply topically daily. 09/14/19   [provider]  escitalopram (LEXAPRO) 10 MG tablet Take 1 tablet (10 mg total) by mouth daily. 08/06/20   Megan Salon, MD  fluticasone (FLONASE) 50 MCG/ACT nasal spray Place 1 spray into both nostrils daily as needed for allergies.  02/05/15   [provider]  Fremanezumab-vfrm (AJOVY) 225 MG/1.5ML SOAJ Inject 225 mg into the  skin every 28 (twenty-eight) days. Patient not taking: Reported on 08/06/2020 04/06/20   Pieter Partridge, DO  gabapentin (NEURONTIN) 300 MG capsule Take 1 capsule (300 mg total) by mouth 3 (three) times daily. Patient not taking: No sig reported 09/26/19   Wallene Huh, DPM  ibuprofen (ADVIL) 600 MG tablet Take 600 mg by mouth every 8 (eight) hours as needed. 09/15/19   [provider]  tiZANidine (ZANAFLEX) 2 MG tablet Take 1 tablet up to three times daily as needed.  Caution for drowsiness. 10/24/19   Pieter Partridge, DO  zolpidem (AMBIEN) 10 MG tablet Take 10 mg by mouth at bedtime as needed for sleep.  10/13/18   [provider]    Allergies    Iodine and Oxycodone  Review of Systems   Review of Systems  All other systems reviewed and are negative.  Physical Exam Updated Vital Signs BP (!) 163/94 (BP Location: Left Arm)   Temp 98.7 F (37.1 C) (Oral)   Resp 15   Ht 1.638 m (5' 4.5")   Wt 87.5 kg   LMP 10/28/2006 (Approximate)   SpO2 100%   BMI 32.62 kg/m   Physical Exam Vitals and nursing note reviewed.  Constitutional:      General: She is not in acute distress.    Appearance: She is well-developed.  HENT:     Head: Normocephalic and atraumatic.     Right Ear: External ear normal.     Left Ear: External ear normal.  Eyes:     General: No scleral icterus.       Right eye: No discharge.        Left eye: No discharge.     Conjunctiva/sclera: Conjunctivae normal.  Neck:     Trachea: No tracheal deviation.  Cardiovascular:     Rate and Rhythm: Normal rate.  Pulmonary:     Effort: Pulmonary effort is normal. No respiratory distress.     Breath sounds: No stridor.  Chest:     Chest wall: Tenderness present. No deformity.     Comments: Tenderness palpation inferior to the right scapula, no crepitus or deformity Abdominal:     General: There is no distension.  Musculoskeletal:        General: Tenderness present. No swelling or deformity.     Right  shoulder: Tenderness present.  Decreased range of motion.     Cervical back: Normal and neck supple.     Thoracic back: Normal.     Lumbar back: Normal.     Comments: Tenderness palpation proximal right shoulder, pain with range of motion; no tenderness palpation elbow or wrist, no tenderness to palpation left upper extremity and bilateral lower extremities  Skin:    General: Skin is warm and dry.     Findings: No rash.  Neurological:     Mental Status: She is alert.     Cranial Nerves: Cranial nerve deficit: no gross deficits.    ED Results / Procedures / Treatments   Labs (all labs ordered are listed, but only abnormal results are displayed) Labs Reviewed - No data to display  EKG None  Radiology DG Ribs Unilateral W/Chest Right  Result Date: 11/16/2020 CLINICAL DATA:  [place] down steps EXAM: RIGHT RIBS AND CHEST - 3+ VIEW COMPARISON:  None. FINDINGS: No fracture or other bone lesions are seen involving the ribs. There is no evidence of pneumothorax or pleural effusion. Both lungs are clear. Heart size and mediastinal contours are within normal limits. IMPRESSION: Negative. Electronically Signed   By: Franchot Gallo M.D.   On: 11/16/2020 10:20   DG Shoulder Right  Result Date: 11/16/2020 CLINICAL DATA:  [place] down steps EXAM: RIGHT SHOULDER - 2+ VIEW COMPARISON:  None. FINDINGS: There is no evidence of fracture or dislocation. There is no evidence of arthropathy or other focal bone abnormality. Soft tissues are unremarkable. IMPRESSION: Negative. Electronically Signed   By: Franchot Gallo M.D.   On: 11/16/2020 10:21    Procedures Procedures   Medications Ordered in ED Medications - No data to display  ED Course  I have reviewed the triage vital signs and the nursing notes.  Pertinent labs & imaging results that were available during my care of the patient were reviewed by me and considered in my medical decision making (see chart for details).    MDM Rules/Calculators/A&P                            Patient presented to the ED for evaluation after a fall.  Pain primarily in the right shoulder and rib area.  Concerning for the possibility of proximal humerus fracture, dislocation as well as rib fracture and chest injury.  No headache or head injury.  No loss of consciousness.  X-rays did not show any signs of fracture or dislocation.  No evidence of rib fracture or pneumothorax on chest x-ray.  Suspect shoulder sprain, possible rotator cuff injury.  Cannot completely exclude an occult rib fracture and discussed this with the patient.  Will treat symptomatically.  Sling for comfort.  Rx NSAIDs.  Outpatient follow-up with PCP or orthopedics if not resolving in the next week Final Clinical Impression(s) / ED Diagnoses Final diagnoses:  Sprain of right shoulder, unspecified shoulder sprain type, initial encounter  Rib contusion, right, initial encounter    Rx / DC Orders ED Discharge Orders          Ordered    etodolac (LODINE) 300 MG capsule  Every 8 hours       Note to Pharmacy: As needed for pain   11/16/20 1035             Dorie Rank, MD 11/16/20 1037

## 2020-11-16 NOTE — Discharge Instructions (Signed)
Take the medications as needed for pain.  Use the sling for comfort.  Follow-up with your primary care doctor or orthopedic doctor if the symptoms do not resolve over the next week

## 2020-11-16 NOTE — ED Triage Notes (Signed)
Lost footing around 7am, injury to rt shoulder. No other reported injuries. No LOC or head injury.

## 2020-11-16 NOTE — Procedures (Signed)
[facility]   History:  Diagnosis: Hemifacial spasm   Initial side: left  Result History  Onset of effect: helped  Duration of Benefit: Has not had injections for over a year.  Patient had decided to hold on the injections, hoping that things would not get worse.  However, she reports that the facial spasm has gotten much worse and she decided to repeat the injections.  Consent obtained from: The patient Benefits discussed included, but were not limited to decreased muscle tightness, increased joint range of motion, and decreased pain.  Risk discussed included, but were not limited pain and discomfort, bleeding, bruising, excessive weakness, venous thrombosis, muscle atrophy and dysphagia.  A copy of the patient medication guide was given to the patient which explains the blackbox warning.  Patients identity and treatment sites confirmed Yes.  .  Details of Procedure: Skin was cleaned with alcohol.  A 30 gauge, 1/2 inch needle was introduced to the target muscle.  Prior to injection, the needle plunger was aspirated to make sure the needle was not within a blood vessel.  There was no blood retrieved on aspiration.    Following is a summary of the muscles injected  And the amount of Botulinum toxin used:  Injections  Location Left  Right Units Number of sites        Corrugator      Frontalis      Lower Lid, Lateral 2.5  2.5 1  Lower Lid Medial 2.5  2.5 1  Upper Lid, Lateral 2.5  2.5 1  Upper Lid, Medial      Canthus 5.0  5.0 1  Temporalis      Nasalis 2.5  2.5 1  Procerus      Zygomaticus Major      TOTAL UNITS:   15    Agent: Botulinum Type A ( Onobotulinum Toxin type A ).  1 vials of Botox were used, each containing 50 units and freshly diluted with 2 mL of sterile, non-perserved saline   Total injected (Units):15 Pt tolerated procedure well without complications.   Reinjection is anticipated in 3 months.  [facility]   History:  Diagnosis: chronic  migraine  Initial side: bilateral    Consent obtained from: The patient Benefits discussed included, but were not limited to decreased muscle tightness, increased joint range of motion, and decreased pain.  Risk discussed included, but were not limited pain and discomfort, bleeding, bruising, excessive weakness, venous thrombosis, muscle atrophy and dysphagia.  A copy of the patient medication guide was given to the patient which explains the blackbox warning.  Patients identity and treatment sites confirmed Yes.  .  Following is a summary of the muscles injected  And the amount of Botulinum toxin used:  Injections  Location Left  Right Units Number of sites        Corrugator 5.0 5.0 10 1 each side  Frontalis 2.5/2.5 2.5/2.5 10 2  each  procerus   5 1  temporalis 5.0 x 4 5.0 x 4 40 4 each  occipitalis 5.0x 2 5.0x 2 20 2  each                                      TOTAL UNITS:   85     Agent: Botulinum Type A ( Onobotulinum Toxin type A ).  100 unit vial of Botox used, each containing 50 units and freshly diluted with  2 mL of sterile, non-perserved saline   Total injected (Units):  85  Total wasted (Units): none wasted (used above for hemifacial spasm) Pt tolerated procedure well without complications.   Reinjection is anticipated in 3 months.

## 2020-11-23 ENCOUNTER — Ambulatory Visit: Payer: BC Managed Care – PPO | Admitting: Neurology

## 2020-11-29 ENCOUNTER — Ambulatory Visit: Payer: BC Managed Care – PPO | Admitting: Neurology

## 2021-01-01 ENCOUNTER — Telehealth: Payer: Self-pay

## 2021-01-01 NOTE — Telephone Encounter (Signed)
New message    I call [place] [facility] to give consent to deliver Botox medication.  Advise medical benefits verification is pending to call back within a week aware of upcoming appt with MD on  1.20.23

## 2021-01-29 NOTE — Telephone Encounter (Signed)
F/u-- website BotoxOne  BOTOX (onabotulinumtoxinA)  ACQUISITION - [place] List of Specialty Pharmacies may not include all available options. If preferred Specialty Pharmacy is not listed, check with payer.  [ ]  Buy and Bill [?]Buy and [place] Available [ ]  Specialty Pharmacy Required [ ]  Prescription Coverage Only - See Pharmacy Benefits Section [ ]  Payer will not release information  Specialty Pharmacies: [facility] [phone] [facility] [phone] [facility] [phone]  COVERAGE DETAILS - [place] The medical plan's renewal date is 11/27/2020. This plan runs on a benefit year basis. Once the medical individual out of pocket maximum has been met, the patient responsibility will be 0%. Under medical, the above referenced payer is unable to confirm the maximum number of units allowed. Please contact the payer by calling [phone] for detailed information regarding unit limitations. Under major medical benefits, the payer has a network of preferred specialty pharmacies. The options shown above are preferred specialty pharmacies within the payer's network. Other specialty pharmacies may be available, however may not be one of the preferred pharmacies. Using a non-preferred pharmacy may result in differing benefits. For additional payer coverage criteria, please reference the BOTOX Policies and Forms link available at [email or web address].

## 2021-01-29 NOTE — Telephone Encounter (Signed)
F/u   Call [place] at [phone]   The patient benefit will need to be re-verify.  [place] is aware of the upcoming appointment on 02/15/21.  Per Caren Griffins will send an urgent email to the billing team.

## 2021-02-15 ENCOUNTER — Other Ambulatory Visit: Payer: Self-pay

## 2021-02-15 ENCOUNTER — Ambulatory Visit (INDEPENDENT_AMBULATORY_CARE_PROVIDER_SITE_OTHER): Payer: BC Managed Care – PPO | Admitting: Neurology

## 2021-02-15 DIAGNOSIS — G5132 Clonic hemifacial spasm, left: Secondary | ICD-10-CM | POA: Diagnosis not present

## 2021-02-15 DIAGNOSIS — G43709 Chronic migraine without aura, not intractable, without status migrainosus: Secondary | ICD-10-CM | POA: Diagnosis not present

## 2021-02-15 NOTE — Procedures (Signed)
[facility]   History:  Diagnosis: Hemifacial spasm   Initial side: left  Result History  Onset of effect: helped    Consent obtained from: The patient Benefits discussed included, but were not limited to decreased muscle tightness, increased joint range of motion, and decreased pain.  Risk discussed included, but were not limited pain and discomfort, bleeding, bruising, excessive weakness, venous thrombosis, muscle atrophy and dysphagia.  A copy of the patient medication guide was given to the patient which explains the blackbox warning.  Patients identity and treatment sites confirmed Yes.  .  Details of Procedure: Skin was cleaned with alcohol.  A 30 gauge, 1/2 inch needle was introduced to the target muscle.  Prior to injection, the needle plunger was aspirated to make sure the needle was not within a blood vessel.  There was no blood retrieved on aspiration.    Following is a summary of the muscles injected  And the amount of Botulinum toxin used:  Injections  Location Left  Right Units Number of sites        Corrugator      Frontalis      Lower Lid, Lateral 2.5  2.5 1  Lower Lid Medial 2.5  2.5 1  Upper Lid, Lateral 2.5  2.5 1  Upper Lid, Medial      Canthus 5.0  5.0 1  Temporalis      Nasalis 2.5  2.5 1  Procerus      Zygomaticus Major      TOTAL UNITS:   15    Agent: Botulinum Type A ( Onobotulinum Toxin type A ).  1 vials of Botox were used, each containing 50 units and freshly diluted with 2 mL of sterile, non-perserved saline   Total injected (Units):15 Pt tolerated procedure well without complications.   Reinjection is anticipated in 3 months.  [facility]   History:  Diagnosis: chronic migraine  Initial side: bilateral    Consent obtained from: The patient Benefits discussed included, but were not limited to decreased muscle tightness, increased joint range of motion, and decreased pain.  Risk discussed included, but were not limited pain and  discomfort, bleeding, bruising, excessive weakness, venous thrombosis, muscle atrophy and dysphagia.  A copy of the patient medication guide was given to the patient which explains the blackbox warning.  Patients identity and treatment sites confirmed Yes.  .  Following is a summary of the muscles injected  And the amount of Botulinum toxin used:  Injections  Location Left  Right Units Number of sites        Corrugator 5.0 5.0 10 1 each side  Frontalis 2.5/2.5 2.5/2.5 10 2  each  procerus   5 1  temporalis 5.0 x 4 5.0 x 4 40 4 each  occipitalis 5.0x 2 5.0x 2 20 2  each                                      TOTAL UNITS:   85     Agent: Botulinum Type A ( Onobotulinum Toxin type A ).  100 unit vial of Botox used, each containing 50 units and freshly diluted with 2 mL of sterile, non-perserved saline   Total injected (Units):  85  Total wasted (Units): none wasted (used above for hemifacial spasm) Pt tolerated procedure well without complications.   Reinjection is anticipated in 3 months.

## 2021-03-14 ENCOUNTER — Telehealth: Payer: Self-pay

## 2021-03-14 NOTE — Telephone Encounter (Signed)
New message   Your information has been submitted to [place]. [employer] [place] will review the request and notify you of the determination decision directly, typically within 72 hours of receiving all information.  You will also receive your request decision electronically. To check for an update later, open this request again from your dashboard.  If [employer] has not responded within the specified timeframe or if you have any questions about your PA submission, contact [place] directly at [phone].  Davian D'AMBROSIO Key: [id number] help? Call us at [phone] Status Sent to [employer] Drug Botox 100UNIT solution Form [employer] [place] Commercial Medical Benefit Electronic Request Form (CB)

## 2021-03-14 NOTE — Telephone Encounter (Signed)
New message   Benefit Verification [id number] Submitted! For BV Basic submissions, please allow 1 business day for results.  For BV Full submissions, please allow 2 business days for results.

## 2021-03-15 NOTE — Telephone Encounter (Signed)
F/u  Margaret Webb Key: [id number] help? Call us at [phone]  Outcome Approved today  Effective from 03/19/2021 through 02/17/2022.  Drug Botox 100UNIT solution  Quantity 4  Form Blue Adult nurse Form (CB)

## 2021-03-19 NOTE — Telephone Encounter (Signed)
F/u  BOTOX (onabotulinumtoxinA)  ACQUISITION - [place] List of Specialty Pharmacies may not include all available options. If preferred Specialty Pharmacy is not listed, check with payer.  _0  Buy and Bill [?]Buy and [place] Available _1  Specialty Pharmacy Required _2  Prescription Coverage Only - See Pharmacy Benefits Section _3  Payer will not release information Specialty Pharmacies: [facility] Specialty Pharmacies: -[place] [facility] [place] [phone], [facility] [place] [facility] [place] - [phone] -[facility] - [phone] -[facility] - [phone]  COVERAGE DETAILS - MAJOR MEDICAL BENEFITS The plan's renewal date is 01/27/2022. This plan runs on a calendar year basis. Once the medical individual or family out of pocket maximum has been met, the patient responsibility will be 0%. Under medical, the above referenced payer is unable to confirm the maximum number of units allowed. Please contact the payer by calling [phone] for detailed information regarding unit limitations. For additional payer coverage criteria, please reference the BOTOX Policies and Forms link available at [email or web address].

## 2021-03-29 ENCOUNTER — Telehealth: Payer: Self-pay

## 2021-03-29 NOTE — Telephone Encounter (Signed)
NOTES SCANNED TO REFERRAL 

## 2021-04-17 ENCOUNTER — Telehealth: Payer: Self-pay

## 2021-04-17 ENCOUNTER — Ambulatory Visit (INDEPENDENT_AMBULATORY_CARE_PROVIDER_SITE_OTHER): Payer: BC Managed Care – PPO | Admitting: Cardiology

## 2021-04-17 ENCOUNTER — Ambulatory Visit (INDEPENDENT_AMBULATORY_CARE_PROVIDER_SITE_OTHER)
Admission: RE | Admit: 2021-04-17 | Discharge: 2021-04-17 | Disposition: A | Discharge status: Home / Self Care | Payer: Self-pay | Source: Ambulatory Visit | Attending: Cardiology | Admitting: Cardiology

## 2021-04-17 ENCOUNTER — Encounter: Payer: Self-pay | Admitting: Cardiology

## 2021-04-17 ENCOUNTER — Other Ambulatory Visit: Payer: Self-pay

## 2021-04-17 VITALS — BP 124/82 | HR 65 | Ht 65.0 in | Wt 196.8 lb

## 2021-04-17 DIAGNOSIS — Z8249 Family history of ischemic heart disease and other diseases of the circulatory system: Secondary | ICD-10-CM

## 2021-04-17 DIAGNOSIS — E7800 Pure hypercholesterolemia, unspecified: Secondary | ICD-10-CM

## 2021-04-17 DIAGNOSIS — I10 Essential (primary) hypertension: Secondary | ICD-10-CM | POA: Diagnosis not present

## 2021-04-17 LAB — LIPID PANEL
Chol/HDL Ratio: 3.5 ratio (ref 0.0–4.4)
Cholesterol, Total: 215 mg/dL — ABNORMAL HIGH (ref 100–199)
HDL: 62 mg/dL (ref >39)
LDL Chol Calc (NIH): 139 mg/dL — ABNORMAL HIGH (ref 0–99)
Triglycerides: 77 mg/dL (ref 0–149)
VLDL Cholesterol Cal: 14 mg/dL (ref 5–40)

## 2021-04-17 LAB — ALT: ALT: 31 IU/L (ref 0–32)

## 2021-04-17 NOTE — Progress Notes (Signed)
? ?Cardiology CONSULT Note   ? ?Date:  04/17/2021  ? ?ID:  Margaret Webb, DOB 07-11-1960, MRN [id number] ? ?PCP:  Aletha Halim., PA-C  ?Cardiologist:  Fransico Him, MD  ? ?Chief Complaint  ?Patient presents with  ? New Patient (Initial Visit)  ?  Hyperlipidemia, hypertension, family history of CAD  ? ? ?History of Present Illness:  ?Margaret Webb is a 61 y.o. female who is being seen today for the evaluation of hypertension, family history of CAD and hyperlipidemia at the request of Aletha Halim., PA-C. ? ?This is a 61 year old female with a history of hypertension, hyperlipidemia and a family history of CAD in her mother and brother who was here for evaluation of hypertension and hyperlipidemia.  Her last lipids that were done on 02/19/2021 showed total cholesterol 220, triglycerides 77, HDL 62 and LDL 147.  She actually has not been on any medication for hypertension in the past.   She was recently started on lisinopril HCT 20-25 mg daily for her hypertension. ? ?She is doing well.  She denies any chest pain or pressure, SOB, DOE, PND, orthopnea, dizziness, palpitations or syncope. Occasionally she will have some mild LE edema. She is compliant with her meds and is tolerating meds with no SE.    ? ?Past Medical History:  ?Diagnosis Date  ? Breast calcification, right 07/27/2012  ? Dental bridge present   ? lower  ? Dental crowns present   ? Headache(784.0)   ? sinus  ? HTN (hypertension)   ? Hypercholesteremia   ? Hyperlipidemia   ? Infertility   ? h/o with second pregnancy  ? Seasonal allergies   ? ? ?Past Surgical History:  ?Procedure Laterality Date  ? BREAST BIOPSY Right 08/09/2012  ? Procedure: BREAST BIOPSY WITH NEEDLE LOCALIZATION;  Surgeon: Haywood Lasso, MD;  Location: [facility];  Service: General;  Laterality: Right;  needle localization at breast center of [place] [address]   ? CESAREAN SECTION    ? CORRECTION HAMMER TOE Left 11/2017  ? with bunionectomy  ? FOOT SURGERY Right  06/08/2018  ? HYSTEROSCOPY W/ ENDOMETRIAL ABLATION  11/09/2006  ? IR 3D INDEPENDENT WKST  12/03/2018  ? IR ANGIO INTRA EXTRACRAN SEL COM CAROTID INNOMINATE BILAT MOD SED  12/03/2018  ? IR ANGIO VERTEBRAL SEL SUBCLAVIAN INNOMINATE UNI L MOD SED  12/03/2018  ? IR ANGIO VERTEBRAL SEL VERTEBRAL UNI R MOD SED  12/03/2018  ? IR US GUIDE VASC ACCESS RIGHT  12/03/2018  ? ? ?Current Medications: ?Current Meds  ?Medication Sig  ? ALPRAZolam (XANAX) 0.5 MG tablet Take 0.25-0.5 mg by mouth daily as needed.  ? calcipotriene-betamethasone (TACLONEX) ointment Apply topically daily.  ? etodolac (LODINE) 300 MG capsule Take 1 capsule (300 mg total) by mouth every 8 (eight) hours.  ? fluticasone (FLONASE) 50 MCG/ACT nasal spray Place 1 spray into both nostrils daily as needed for allergies.   ? lisinopril-hydrochlorothiazide (ZESTORETIC) 20-25 MG tablet Take 1 tablet by mouth daily.  ? rosuvastatin (CRESTOR) 40 MG tablet Take 40 mg by mouth daily.  ? zolpidem (AMBIEN) 10 MG tablet Take 10 mg by mouth at bedtime as needed for sleep.   ? [DISCONTINUED] atorvastatin (LIPITOR) 40 MG tablet Take 40 mg by mouth daily.  ? ? ?Allergies:   Iodine and Oxycodone  ? ?Social History  ? ?Socioeconomic History  ? Marital status: Married  ?  Spouse name: Not on file  ? Number of children: 1  ? Years of education:  12  ? Highest education level: 12th grade  ?Occupational History  ?  Employer: [employer]  ?Tobacco Use  ? Smoking status: Never  ? Smokeless tobacco: Never  ?Vaping Use  ? Vaping Use: Never used  ?Substance and Sexual Activity  ? Alcohol use: Yes  ?  Alcohol/week: 1.0 standard drink  ?  Types: 1 Standard drinks or equivalent per week  ?  Comment: maybe once a month  ? Drug use: No  ? Sexual activity: Yes  ?  Partners: Male  ?  Birth control/protection: Other-see comments  ?  Comment: vasectomy  ?Other Topics Concern  ? Not on file  ?Social History Narrative  ? Patient is right-handed. She lives with her husband in a one level home. She  drinks one cup of coffee a day, and 2 glasses of tea a week, other times water. She exercises 3-4 x a week.   ? ?Social Determinants of Health  ? ?Financial Resource Strain: Not on file  ?Food Insecurity: Not on file  ?Transportation Needs: Not on file  ?Physical Activity: Not on file  ?Stress: Not on file  ?Social Connections: Not on file  ?  ? ?Family History:  The patient's family history includes Breast cancer in her cousin and sister; Cancer in her paternal grandfather and sister; Diabetes in her paternal grandfather; Healthy in her brother and daughter; Heart attack in her maternal grandmother and paternal grandmother; Heart attack (age of onset: 51) in her mother; Heart attack (age of onset: 27) in her brother; Hypercholesterolemia in her father; Hypertension in her mother.  ? ?ROS:   ?Please see the history of present illness.    ?ROS All other systems reviewed and are negative. ? ? ? ?PHYSICAL EXAM:   ?VS:  BP 124/82   Pulse 65   Ht '5\' 5"'$  (1.651 m)   Wt 196 lb 12.8 oz (89.3 kg)   LMP 10/28/2006 (Approximate)   SpO2 99%   BMI 32.75 kg/m?    ?GEN: Well nourished, well developed, in no acute distress  ?HEENT: normal  ?Neck: no JVD, carotid bruits, or masses ?Cardiac: RRR; no murmurs, rubs, or gallops,no edema.  Intact distal pulses bilaterally.  ?Respiratory:  clear to auscultation bilaterally, normal work of breathing ?GI: soft, nontender, nondistended, + BS ?MS: no deformity or atrophy  ?Skin: warm and dry, no rash ?Neuro:  Alert and Oriented x 3, Strength and sensation are intact ?Psych: euthymic mood, full affect ? ?Wt Readings from Last 3 Encounters:  ?04/17/21 196 lb 12.8 oz (89.3 kg)  ?11/16/20 193 lb (87.5 kg)  ?08/06/20 193 lb (87.5 kg)  ?  ? ? ?Studies/Labs Reviewed:  ? ?EKG:  EKG is ordered today.  The ekg ordered today demonstrates normal sinus rhythm with no ST changes ? ?Recent Labs: ?No results found for requested labs within last 8760 hours.  ? ?Lipid Panel ?   ?Component Value Date/Time   ? CHOL 229 (H) 08/04/2019 0934  ? TRIG 55 08/04/2019 0934  ? HDL 64 08/04/2019 0934  ? CHOLHDL 3.6 08/04/2019 0934  ? CHOLHDL 4.0 12/15/2013 1354  ? VLDL 13 12/15/2013 1354  ? [place] 156 (H) 08/04/2019 0934  ? ? ?Additional studies/ records that were reviewed today include:  ?Office visit notes from PCP ? ? ? ?ASSESSMENT:   ? ?1. Pure hypercholesterolemia   ?2. Family history of premature CAD   ?3. Primary hypertension   ? ? ? ?PLAN:  ?In order of problems listed above: ? ?Hyperlipidemia ?-Her  LDL goal is less than 100 and she was recently placed on Crestor 40 mg daily for LDL of 147 ?-Given her family history of CAD I think it would be prudent to get a coronary calcium score and if she does have coronary calcium then we need to be  ?-Continue prescription drug management with Crestor 40 mg daily with as needed refills>>she was changed from atorvastatin to Crestor in January ?-repeat FLP and ALT on Crestor ? ?2.  Family history of premature CAD ?-Her brother had a heart attack in her mother has a history of CAD ?-She is completely asymptomatic ?-Recommend coronary calcium score to assess future risk ?-I will get a baseline exercise treadmill test to rule out ischemia ?-Shared Decision Making/Informed Consent ?The risks [chest pain, shortness of breath, cardiac arrhythmias, dizziness, blood pressure fluctuations, myocardial infarction, stroke/transient ischemic attack, and life-threatening complications (estimated to be 1 in 10,000)], benefits (risk stratification, diagnosing coronary artery disease, treatment guidance) and alternatives of an exercise tolerance test were discussed in detail with Margaret Webb and she agrees to proceed. ? ?3.  Hypertension ?-BP is actually controlled on exam today ?-Continue prescription drug management with lisinopril HCT 20-25 mg daily with as needed refills ? ?Time Spent: ?20 minutes total time of encounter, including 15 minutes spent in face-to-face patient care on the date of  this encounter. This time includes coordination of care and counseling regarding above mentioned problem list. Remainder of non-face-to-face time involved reviewing chart documents/testing relevant to the patient

## 2021-04-17 NOTE — Telephone Encounter (Signed)
-----   Message from Sueanne Margarita, MD sent at 04/17/2021  9:59 AM EDT ----- ?Noncoronary portion of coronaries calcium score showed aortic atherosclerosis otherwise normal.  Coronary score pending ?

## 2021-04-17 NOTE — Addendum Note (Signed)
Addended by: Antonieta Iba on: 04/17/2021 09:08 AM ? ? Modules accepted: Orders ? ?

## 2021-04-17 NOTE — Addendum Note (Signed)
Addended by: Antonieta Iba on: 04/17/2021 09:16 AM ? ? Modules accepted: Orders ? ?

## 2021-04-17 NOTE — Patient Instructions (Signed)
Medication Instructions:  ?Your physician recommends that you continue on your current medications as directed. Please refer to the Current Medication list given to you today. ? ?*If you need a refill on your cardiac medications before your next appointment, please call your pharmacy* ? ? ?Lab Work: ?TODAY: FLP and ALT ?If you have labs (blood work) drawn today and your tests are completely normal, you will receive your results only by: ?MyChart Message (if you have MyChart) OR ?A paper copy in the mail ?If you have any lab test that is abnormal or we need to change your treatment, we will call you to review the results. ? ? ?Testing/Procedures: ?Your physician has requested that you have an exercise tolerance test. For further information please visit [email or web address]. Please also follow instruction sheet, as given. ? ?Your physician has requested that you have a calcium score CT scan today. There is a $99 fee for the scan.   ? ? ?Follow-Up: ?At [facility], you and your health needs are our priority.  As part of our continuing mission to provide you with exceptional heart care, we have created designated Provider Care Teams.  These Care Teams include your primary Cardiologist (physician) and Advanced Practice Providers (APPs -  Physician Assistants and Nurse Practitioners) who all work together to provide you with the care you need, when you need it. ? ?Follow up with Dr. Radford Pax as needed based on results of testing ? ?

## 2021-04-17 NOTE — Addendum Note (Signed)
Addended by: Sueanne Margarita on: 04/17/2021 09:15 AM ? ? Modules accepted: Orders ? ?

## 2021-04-17 NOTE — Addendum Note (Signed)
Addended by: Antonieta Iba on: 04/17/2021 09:22 AM ? ? Modules accepted: Orders ? ?

## 2021-04-17 NOTE — Addendum Note (Signed)
Addended by: Antonieta Iba on: 04/17/2021 09:14 AM ? ? Modules accepted: Orders ? ?

## 2021-04-18 ENCOUNTER — Encounter: Payer: Self-pay | Admitting: Cardiology

## 2021-04-18 DIAGNOSIS — R931 Abnormal findings on diagnostic imaging of heart and coronary circulation: Secondary | ICD-10-CM | POA: Insufficient documentation

## 2021-04-18 DIAGNOSIS — I700 Atherosclerosis of aorta: Secondary | ICD-10-CM | POA: Insufficient documentation

## 2021-04-18 DIAGNOSIS — E7800 Pure hypercholesterolemia, unspecified: Secondary | ICD-10-CM

## 2021-04-18 MED ORDER — EZETIMIBE 10 MG PO TABS: 10.0000 mg | ORAL_TABLET | Freq: Every day | ORAL | 3 refills | Status: DC

## 2021-04-18 NOTE — Telephone Encounter (Signed)
-----   Message from Sueanne Margarita, MD sent at 04/18/2021  9:34 AM EDT ----- ?LDL goal less than 70 due to coronary artery calcification.  Please continue Crestor 40 mg daily and add Zetia 10 mg daily with repeat FLP and ALT in 6 weeks ?

## 2021-04-18 NOTE — Telephone Encounter (Signed)
Call patient and reviewed results. All questions (if any) were answered. ?Antonieta Iba, RN 04/18/2021 11:51 AM  ?Rx has been sent in for Zetia. Repeat labs have been scheduled.  ?

## 2021-05-17 ENCOUNTER — Ambulatory Visit: Payer: BC Managed Care – PPO | Admitting: Neurology

## 2021-05-23 ENCOUNTER — Ambulatory Visit (INDEPENDENT_AMBULATORY_CARE_PROVIDER_SITE_OTHER): Payer: BC Managed Care – PPO

## 2021-05-23 DIAGNOSIS — Z8249 Family history of ischemic heart disease and other diseases of the circulatory system: Secondary | ICD-10-CM | POA: Diagnosis not present

## 2021-05-23 DIAGNOSIS — E7800 Pure hypercholesterolemia, unspecified: Secondary | ICD-10-CM

## 2021-05-23 DIAGNOSIS — I10 Essential (primary) hypertension: Secondary | ICD-10-CM

## 2021-05-23 LAB — EXERCISE TOLERANCE TEST
Angina Index: 0
Base ST Depression (mm): 0 mm
Duke Treadmill Score: 7
Estimated workload: 8.5
Exercise duration (min): 7 min
Exercise duration (sec): 0 s
MPHR: 160 {beats}/min
Peak HR: 160 {beats}/min
Percent HR: 100 %
RPE: 15
Rest HR: 68 {beats}/min
ST Depression (mm): 0 mm

## 2021-05-24 ENCOUNTER — Ambulatory Visit: Payer: BC Managed Care – PPO | Admitting: Neurology

## 2021-05-27 ENCOUNTER — Telehealth: Payer: Self-pay | Admitting: Pharmacy Technician

## 2021-05-27 NOTE — Telephone Encounter (Signed)
Submitted a Prior Authorization request to [facility] for  Botox  via CoverMyMeds. Will update once we receive a response. ? ? ?Key: [id number]  ?

## 2021-05-28 ENCOUNTER — Other Ambulatory Visit: Payer: Self-pay

## 2021-05-28 ENCOUNTER — Other Ambulatory Visit (HOSPITAL_COMMUNITY): Payer: Self-pay

## 2021-05-28 ENCOUNTER — Ambulatory Visit: Payer: BC Managed Care – PPO | Admitting: Neurology

## 2021-05-28 DIAGNOSIS — G5132 Clonic hemifacial spasm, left: Secondary | ICD-10-CM

## 2021-05-28 MED ORDER — BOTOX 100 UNITS IJ SOLR: 100.0000 [IU] | Freq: Once | INTRAMUSCULAR | 2 refills | Status: DC

## 2021-05-28 NOTE — Telephone Encounter (Signed)
Refill sent.

## 2021-05-28 NOTE — Telephone Encounter (Signed)
Botox letter received and faxed to [facility] ?

## 2021-05-28 NOTE — Telephone Encounter (Signed)
PA was canceled due to Active PA on file- Effective from 03/19/2021 through 02/17/2022. ? ?Key: [id number] ? ?[place] advised that [facility] is listed as a pharmacy on the PA. ? ?Pharmacy will need to call [phone] if they are having any issues processing. ? ?Called [facility], they will have their benefits team to complete a benefits investigation to verify active PA. Pharmacist states they also need a new prescription for the patient's Botox.Pharmacy is requesting copy of active PA letter- [facility] will fax and once received it needs faxed to [phone]- [place]. (Pt name and DOB needs to be on cover letter)  ? ?Sent message to [facility] to notify about refill needed. ? ? ?

## 2021-05-31 ENCOUNTER — Ambulatory Visit: Payer: BC Managed Care – PPO | Admitting: Neurology

## 2021-06-07 ENCOUNTER — Ambulatory Visit (INDEPENDENT_AMBULATORY_CARE_PROVIDER_SITE_OTHER): Payer: BC Managed Care – PPO | Admitting: Neurology

## 2021-06-07 DIAGNOSIS — G43709 Chronic migraine without aura, not intractable, without status migrainosus: Secondary | ICD-10-CM | POA: Diagnosis not present

## 2021-06-07 DIAGNOSIS — G5132 Clonic hemifacial spasm, left: Secondary | ICD-10-CM

## 2021-06-07 MED ORDER — ONABOTULINUMTOXINA 100 UNITS IJ SOLR
100.0000 [IU] | Freq: Once | INTRAMUSCULAR | Status: AC
  Administered 2021-06-07: 100 [IU] via INTRAMUSCULAR

## 2021-06-07 NOTE — Procedures (Signed)
[facility]ulinum Clinic  ? ?History:  ?Diagnosis: Hemifacial spasm  ? ?Initial side: left  ?Result History  ?Onset of effect: helped .  Is a little late for botox b/c she had to r/s appt and notes some wearing off ? ? ?Consent obtained from: The patient ?Benefits discussed included, but were not limited to decreased muscle tightness, increased joint range of motion, and decreased pain.  Risk discussed included, but were not limited pain and discomfort, bleeding, bruising, excessive weakness, venous thrombosis, muscle atrophy and dysphagia.  A copy of the patient medication guide was given to the patient which explains the blackbox warning. ? ?Patients identity and treatment sites confirmed Yes.  . ? ?Details of Procedure: ?Skin was cleaned with alcohol.  A 30 gauge, 1/2 inch needle was introduced to the target muscle.  Prior to injection, the needle plunger was aspirated to make sure the needle was not within a blood vessel.  There was no blood retrieved on aspiration.   ? ?Following is a summary of the muscles injected  And the amount of Botulinum toxin used: ? ?Injections  ?Location Left  Right Units Number of sites  ?      ?Corrugator      ?Frontalis      ?Lower Lid, Lateral 2.5  2.5 1  ?Lower Lid Medial 2.5  2.5 1  ?Upper Lid, Lateral 2.5  2.5 1  ?Upper Lid, Medial      ?Canthus 5.0  5.0 1  ?Temporalis      ?Nasalis 2.5  2.5 1  ?Procerus      ?Zygomaticus Major      ?TOTAL UNITS:   15   ? ?Agent: Botulinum Type A ( Onobotulinum Toxin type A ).  1 vials of Botox were used, each containing 50 units and freshly diluted with 2 mL of sterile, non-perserved saline ? ? Total injected (Units):15 ?Pt tolerated procedure well without complications.   ?Reinjection is anticipated in 3 months. ? ?[facility]um Clinic  ? ?History:  ?Diagnosis: chronic migraine ? ?Initial side: bilateral  ? ? ?Consent obtained from: The patient ?Benefits discussed included, but were not limited to decreased muscle tightness, increased joint range of  motion, and decreased pain.  Risk discussed included, but were not limited pain and discomfort, bleeding, bruising, excessive weakness, venous thrombosis, muscle atrophy and dysphagia.  A copy of the patient medication guide was given to the patient which explains the blackbox warning. ? ?Patients identity and treatment sites confirmed Yes.  . ? ?Following is a summary of the muscles injected  And the amount of Botulinum toxin used: ? ?Injections  ?Location Left  Right Units Number of sites  ?      ?Corrugator 5.0 5.0 10 1 each side  ?Frontalis 2.5/2.5 2.5/2.'5 10 2 '$ each  ?procerus   5 1  ?temporalis 5.0 x 4 5.0 x 4 40 4 each  ?occipitalis 5.0x 2 5.0x '2 20 2 '$ each  ?      ?      ?      ?      ?      ?      ?TOTAL UNITS:   85   ? ? ?Agent: Botulinum Type A ( Onobotulinum Toxin type A ).  100 unit vial of Botox used, each containing 50 units and freshly diluted with 2 mL of sterile, non-perserved saline ? ? Total injected (Units):  85 ? Total wasted (Units): none wasted (used above for hemifacial spasm) ?Pt tolerated procedure  well without complications.   ?Reinjection is anticipated in 3 months. ?

## 2021-06-12 ENCOUNTER — Other Ambulatory Visit: Payer: BC Managed Care – PPO

## 2021-06-19 ENCOUNTER — Other Ambulatory Visit: Payer: BC Managed Care – PPO

## 2021-07-03 ENCOUNTER — Other Ambulatory Visit: Payer: BC Managed Care – PPO

## 2021-07-03 DIAGNOSIS — E7800 Pure hypercholesterolemia, unspecified: Secondary | ICD-10-CM

## 2021-07-03 LAB — LIPID PANEL
Chol/HDL Ratio: 2.5 ratio (ref 0.0–4.4)
Cholesterol, Total: 158 mg/dL (ref 100–199)
HDL: 64 mg/dL (ref >39)
LDL Chol Calc (NIH): 83 mg/dL (ref 0–99)
Triglycerides: 55 mg/dL (ref 0–149)
VLDL Cholesterol Cal: 11 mg/dL (ref 5–40)

## 2021-07-03 LAB — ALT: ALT: 51 IU/L — ABNORMAL HIGH (ref 0–32)

## 2021-07-04 ENCOUNTER — Telehealth: Payer: Self-pay

## 2021-07-04 DIAGNOSIS — E7800 Pure hypercholesterolemia, unspecified: Secondary | ICD-10-CM

## 2021-07-04 NOTE — Telephone Encounter (Signed)
Spoke with patient to review labs results and Dr. Theodosia Blender recommendations for the Lipid Clinic referral.  Patient has concerns about the increase in ALT level. I advised patient I would notify Dr. Radford Pax of her concerns.

## 2021-07-04 NOTE — Telephone Encounter (Signed)
-----   Message from Sueanne Margarita, MD sent at 07/03/2021  5:06 PM EDT ----- LDL not at goal of < 70 - refer to lipid clinic

## 2021-07-26 ENCOUNTER — Ambulatory Visit: Payer: BC Managed Care – PPO

## 2021-08-15 ENCOUNTER — Ambulatory Visit (INDEPENDENT_AMBULATORY_CARE_PROVIDER_SITE_OTHER): Payer: BC Managed Care – PPO | Admitting: Pharmacist

## 2021-08-15 ENCOUNTER — Encounter: Payer: Self-pay | Admitting: Pharmacist

## 2021-08-15 VITALS — BP 100/64 | HR 64

## 2021-08-15 DIAGNOSIS — I700 Atherosclerosis of aorta: Secondary | ICD-10-CM

## 2021-08-15 DIAGNOSIS — E7800 Pure hypercholesterolemia, unspecified: Secondary | ICD-10-CM

## 2021-08-15 DIAGNOSIS — I10 Essential (primary) hypertension: Secondary | ICD-10-CM | POA: Diagnosis not present

## 2021-08-15 DIAGNOSIS — R931 Abnormal findings on diagnostic imaging of heart and coronary circulation: Secondary | ICD-10-CM

## 2021-08-15 MED ORDER — REPATHA SURECLICK 140 MG/ML ~~LOC~~ SOAJ: 1.0000 | SUBCUTANEOUS | 3 refills | Status: AC

## 2021-08-15 NOTE — Patient Instructions (Addendum)
Your LDL cholesterol is 83 and your goal is < 70  Continue taking rosuvastatin  I will submit information to your insurance for Repatha and let you know when I hear back. Once you start Repatha, you can stop ezetimibe   Repatha is a subcutaneous injection given once every 2 weeks in the fatty tissue of your stomach or upper outer thigh. Store the medication in the fridge. You can let your dose warm up to room temperature for 30 minutes before injecting if you prefer. Repatha will lower your LDL cholesterol by 60% and helps to lower your chance of having a heart attack or stroke.

## 2021-08-15 NOTE — Progress Notes (Signed)
Patient ID: Margaret Webb                 DOB: [birth year]                    MRN: [id number]     HPI: Margaret Webb is a 61 y.o. female patient referred to lipid clinic by Dr Radford Pax. PMH is significant for HTN, HLD, and FHx of CAD - mother with MI at age 81 and brother with MI at age 63. She underwent calcium scoring on 04/17/21 which was elevated at 54 (96th percentile for age, gender and race matched control) and showed aortic atherosclerosis.  Pt presents today for follow up. Reports tolerating rosuvastatin and ezetimibe well. Had a surprisingly strong response to ezetimibe with additional 40% LDL lowering. Slight increase in ALT after starting ezetimibe - pt denies alcohol and Tylenol use that could have contributed. Has taken cholesterol meds for most of her adult life. Highest LDL I can see in Epic was 178 back in 2019.   Reports cutting her lisinopril-HCTZ 20-'25mg'$  tablet in half due to low BP of 92/77, feeling dizzy, and having no energy. Felt better after reducing dose, BP was up to 104/79. Checked again today, still on lower end of normal. Reports med was started by PCP last fall when she was going through a stressful time. Had no issues with high BP before or after that.  [place] lipid score = 10 without knowing her baseline LDL pre statin therapy (FHx premature CAD - 1 pt, personal hx premature CAD - 2 pts, xanthelasmas - 6 pts, LDL - 1 pt).  Current Medications: rosuvastatin '40mg'$  daily, ezetimibe '10mg'$  daily Risk Factors: elevated calcium score, FHx CAD LDL goal: '70mg'$ /dL  Diet: started Weight Watchers last week  Exercise: Peloton bike  Family History: Breast cancer in her cousin and sister; Cancer in her paternal grandfather and sister; Diabetes in her paternal grandfather; Healthy in her brother and daughter; Heart attack in her maternal grandmother and paternal grandmother; Heart attack (age of onset: 50) in her mother; Heart attack (age of onset: 59) in her brother;  Hypercholesterolemia in her father; Hypertension in her mother.   Social History: Occasional alcohol use, denies tobacco and drug use.  Labs: 07/03/21: TC 158, TG 55, HDL 64, LDL 83, ALT 51 (rosuvastatin '40mg'$  daily, ezetimibe '10mg'$  daily) 04/17/21: TC 215, TG 77, HDL 62, LDL 139, ALT 31 (rosuvastatin '40mg'$  daily) 02/19/21: TC 220, TG 77, HDL 62, LDL 147 (atorvastatin '40mg'$  daily)  Past Medical History:  Diagnosis Date   Agatston coronary artery calcium score between 200 and 399    calcium score of 287 which is 48 percentile for age and gender matched controls   Aortic atherosclerosis ([place])    Breast calcification, right 07/27/2012   Dental bridge present    lower   Dental crowns present    Headache(784.0)    sinus   HTN (hypertension)    Hypercholesteremia    Hyperlipidemia    Infertility    h/o with second pregnancy   Seasonal allergies     Current Outpatient Medications on File Prior to Visit  Medication Sig Dispense Refill   ALPRAZolam (XANAX) 0.5 MG tablet Take 0.25-0.5 mg by mouth daily as needed.     calcipotriene-betamethasone (TACLONEX) ointment Apply topically daily.     etodolac (LODINE) 300 MG capsule Take 1 capsule (300 mg total) by mouth every 8 (eight) hours. 21 capsule 0   ezetimibe (ZETIA) 10 MG tablet Take 1  tablet (10 mg total) by mouth daily. 90 tablet 3   fluticasone (FLONASE) 50 MCG/ACT nasal spray Place 1 spray into both nostrils daily as needed for allergies.      lisinopril-hydrochlorothiazide (ZESTORETIC) 20-25 MG tablet Take 1 tablet by mouth daily.     rosuvastatin (CRESTOR) 40 MG tablet Take 40 mg by mouth daily.     zolpidem (AMBIEN) 10 MG tablet Take 10 mg by mouth at bedtime as needed for sleep.      No current facility-administered medications on file prior to visit.    Allergies  Allergen Reactions   Iodine     swelling   Oxycodone Nausea Only    Pt stated, "it made my stomach upset" and cognitive change    Assessment/Plan:  1.  Hyperlipidemia - LDL dropped from 139 to 83 after adding ezetimibe '10mg'$  daily, continues on rosuvastatin '40mg'$  daily as well. LDL goal < 70 due to elevated calcium score, also positive for FH based on [place] lipid score of 10 even without knowing her baseline LDL pre-statin. Discussed PCSK9i today which pt is agreeable to. Prior authorization for Repatha submitted and approved through 08/14/22. She wishes to minimize meds as able, will plan to stop ezetimibe once she starts on Repatha, but continue her rosuvastatin. Will qualify for $5 copay card and will plan to recheck labs in Sept.  2. Hypertension - BP low today on 1/2 tablet of her lisinopril-HCTZ 20-'25mg'$ . Advised pt she can stop her BP med and to monitor BP at home. This was started last year during a time of stress and she has not had BP issues outside of this. She's aware to let us know if BP increases consistently above goal < 130/42mHg.  Margaret Webb, PharmD, BCACP, [address] [id number] [place] [address]Phone: [phone] Fax: [phone]/20/2023 3:26 PM

## 2021-08-19 ENCOUNTER — Ambulatory Visit (INDEPENDENT_AMBULATORY_CARE_PROVIDER_SITE_OTHER): Payer: BC Managed Care – PPO | Admitting: Obstetrics & Gynecology

## 2021-08-19 ENCOUNTER — Encounter (HOSPITAL_BASED_OUTPATIENT_CLINIC_OR_DEPARTMENT_OTHER): Payer: Self-pay | Admitting: Obstetrics & Gynecology

## 2021-08-19 ENCOUNTER — Other Ambulatory Visit (HOSPITAL_BASED_OUTPATIENT_CLINIC_OR_DEPARTMENT_OTHER): Payer: Self-pay | Admitting: Obstetrics & Gynecology

## 2021-08-19 VITALS — BP 125/89 | HR 61 | Ht 64.25 in | Wt 198.2 lb

## 2021-08-19 DIAGNOSIS — Z803 Family history of malignant neoplasm of breast: Secondary | ICD-10-CM | POA: Diagnosis not present

## 2021-08-19 DIAGNOSIS — Z780 Asymptomatic menopausal state: Secondary | ICD-10-CM

## 2021-08-19 DIAGNOSIS — Z01419 Encounter for gynecological examination (general) (routine) without abnormal findings: Secondary | ICD-10-CM | POA: Diagnosis not present

## 2021-08-19 MED ORDER — MIRABEGRON ER 50 MG PO TB24: 50.0000 mg | ORAL_TABLET | Freq: Every day | ORAL | 2 refills | Status: DC

## 2021-08-19 MED ORDER — ROSUVASTATIN CALCIUM 40 MG PO TABS: 40.0000 mg | ORAL_TABLET | Freq: Every day | ORAL | Status: AC

## 2021-08-19 NOTE — Patient Instructions (Signed)
Margaret Humble, MD [facility] [address] [place], [place] [address]  Phone: [phone] Fax: [phone]

## 2021-08-19 NOTE — Progress Notes (Signed)
61 y.o. G2P1 Married White or Caucasian female here for annual exam.  Doing well.  Going to start Rapatha this week.  Pharm D at Dr. Theodosia Blender office saw pt on 7/20.  She will start this later this week.  She stopped blood pressure medication to feeling very symptomatic when on it.  Denies vaginal bleeding.    Has two beautiful grandchildren in the [place], [place], area.  Going to move to the [place] area.    Patient's last menstrual period was 01/27/2006.          Sexually active: Yes.    The current method of family planning is vasectomy.    Exercising: Yes.     Rides [place] Smoker:  no  Health Maintenance: Pap:  08/04/19 neg, neg HR HPV History of abnormal Pap:  no MMG:  11/14/20 neg Colonoscopy:  10/31/16, follow up 10 years BMD:   11/14/20 normal Screening Labs: done with Dr. Radford Pax and Emmie Niemann   reports that she has never smoked. She has never used smokeless tobacco. She reports current alcohol use of about 1.0 standard drink of alcohol per week. She reports that she does not use drugs.  Past Medical History:  Diagnosis Date   Agatston coronary artery calcium score between 200 and 399    calcium score of 287 which is 7 percentile for age and gender matched controls   Aortic atherosclerosis ([place])    Breast calcification, right 07/27/2012   Dental bridge present    lower   Dental crowns present    Headache(784.0)    sinus   HTN (hypertension)    Hypercholesteremia    Hyperlipidemia    Infertility    h/o with second pregnancy   Seasonal allergies     Past Surgical History:  Procedure Laterality Date   BREAST BIOPSY Right 08/09/2012   Procedure: BREAST BIOPSY WITH NEEDLE LOCALIZATION;  Surgeon: Haywood Lasso, MD;  Location: [facility];  Service: General;  Laterality: Right;  needle localization at breast center of [place] TOE Left 11/2017   with bunionectomy   FOOT SURGERY Right 06/08/2018    HYSTEROSCOPY W/ ENDOMETRIAL ABLATION  11/09/2006   IR 3D INDEPENDENT WKST  12/03/2018   IR ANGIO INTRA EXTRACRAN SEL COM CAROTID INNOMINATE BILAT MOD SED  12/03/2018   IR ANGIO VERTEBRAL SEL SUBCLAVIAN INNOMINATE UNI L MOD SED  12/03/2018   IR ANGIO VERTEBRAL SEL VERTEBRAL UNI R MOD SED  12/03/2018   IR US GUIDE VASC ACCESS RIGHT  12/03/2018    Current Outpatient Medications  Medication Sig Dispense Refill   ALPRAZolam (XANAX) 0.5 MG tablet Take 0.25-0.5 mg by mouth daily as needed.     calcipotriene-betamethasone (TACLONEX) ointment Apply topically daily.     etodolac (LODINE) 300 MG capsule Take 1 capsule (300 mg total) by mouth every 8 (eight) hours. 21 capsule 0   Evolocumab (REPATHA SURECLICK) 536 MG/ML SOAJ Inject 1 Pen into the skin every 14 (fourteen) days. 6 mL 3   fluticasone (FLONASE) 50 MCG/ACT nasal spray Place 1 spray into both nostrils daily as needed for allergies.      zolpidem (AMBIEN) 10 MG tablet Take 10 mg by mouth at bedtime as needed for sleep.      rosuvastatin (CRESTOR) 40 MG tablet Take 1 tablet (40 mg total) by mouth daily.     No current facility-administered medications for this visit.    Family History  Problem  Relation Age of Onset   Hypertension Mother    Heart attack Mother 92   Hypercholesterolemia Father    Cancer Sister        breast, bone   Breast cancer Sister    Heart attack Brother 27   Healthy Brother    Heart attack Maternal Grandmother    Heart attack Paternal Grandmother    Diabetes Paternal Grandfather    Cancer Paternal Grandfather        unknown type   Healthy Daughter    Breast cancer Cousin    ROS: Genitourinary:negative  Exam:   BP 125/89   Pulse 61   Ht 5' 4.25" (1.632 m)   Wt 198 lb 3.2 oz (89.9 kg)   LMP 01/27/2006   BMI 33.76 kg/m   Height: 5' 4.25" (163.2 cm)  General appearance: alert, cooperative and appears stated age Head: Normocephalic, without obvious abnormality, atraumatic Neck: no adenopathy, supple,  symmetrical, trachea midline and thyroid normal to inspection and palpation Lungs: clear to auscultation bilaterally Breasts: normal appearance, no masses or tenderness Heart: regular rate and rhythm Abdomen: soft, non-tender; bowel sounds normal; no masses,  no organomegaly Extremities: extremities normal, atraumatic, no cyanosis or edema Skin: Skin color, texture, turgor normal. No rashes or lesions Lymph nodes: Cervical, supraclavicular, and axillary nodes normal. No abnormal inguinal nodes palpated Neurologic: Grossly normal   Pelvic: External genitalia:  no lesions              Urethra:  normal appearing urethra with no masses, tenderness or lesions              Bartholins and Skenes: normal                 Vagina: normal appearing vagina with normal color and no discharge, no lesions              Cervix: no lesions              Pap taken: No. Bimanual Exam:  Uterus:  normal size, contour, position, consistency, mobility, non-tender              Adnexa: normal adnexa and no mass, fullness, tenderness               Rectovaginal: Confirms               Anus:  normal sphincter tone, no lesions  Chaperone, Ezekiel Ina, RN, was present for exam.  Assessment/Plan: 1. Well woman exam with routine gynecological exam - Pap smear 07/2019 with neg HR HPV - Mammogram 10/2020 - Colonoscopy 2018 follow up 10 years - Bone mineral density 2022.  Normal.  Plan to repeat 5 years - lab work done done with PCP - vaccines reviewed/updated.  She does not want Shingrix vaccination at this time.  2. Postmenopausal - not on HRT  3. Family history of breast cancer - [employer] Cusick model done last year with ~14% lifetime risk of breast cancer.  Screening breast MRI discussed last year and today.  Pt declines for now. - sister did have negative genetic testing

## 2021-08-21 ENCOUNTER — Other Ambulatory Visit (HOSPITAL_BASED_OUTPATIENT_CLINIC_OR_DEPARTMENT_OTHER): Payer: Self-pay | Admitting: Obstetrics & Gynecology

## 2021-08-21 MED ORDER — OXYBUTYNIN CHLORIDE ER 5 MG PO TB24: 5.0000 mg | ORAL_TABLET | Freq: Every day | ORAL | 2 refills | Status: DC

## 2021-09-20 ENCOUNTER — Telehealth: Payer: Self-pay | Admitting: Neurology

## 2021-09-20 ENCOUNTER — Telehealth (HOSPITAL_COMMUNITY): Payer: Self-pay | Admitting: Pharmacy Technician

## 2021-09-20 ENCOUNTER — Ambulatory Visit (INDEPENDENT_AMBULATORY_CARE_PROVIDER_SITE_OTHER): Payer: BC Managed Care – PPO | Admitting: Neurology

## 2021-09-20 DIAGNOSIS — G43709 Chronic migraine without aura, not intractable, without status migrainosus: Secondary | ICD-10-CM

## 2021-09-20 DIAGNOSIS — G5132 Clonic hemifacial spasm, left: Secondary | ICD-10-CM

## 2021-09-20 MED ORDER — ONABOTULINUMTOXINA 100 UNITS IJ SOLR
100.0000 [IU] | Freq: Once | INTRAMUSCULAR | Status: AC
  Administered 2021-09-20: 100 [IU] via INTRAMUSCULAR

## 2021-09-20 NOTE — Telephone Encounter (Signed)
Patient Advocate Encounter   Received notification that prior authorization for Botox 100UNIT solution is required.   PA submitted on 09/20/2021 Key [id number] Status is pending       Lyndel Safe, [place] Patient Advocate Specialist [place] Patient Advocate Team Direct Number: [phone]  Fax: [phone]

## 2021-09-20 NOTE — Procedures (Signed)
Botulinum Clinic   History:  Diagnosis: Hemifacial spasm; blepharospasm  Initial side: left  Result History  Onset of effect: helped .  She does report she is noticing some symptoms on the right side of the face and asks if we can inject around the right eye as well.  Told her that we will have to wait for authorization for blepharospasm pattern.   Consent obtained from: The patient Benefits discussed included, but were not limited to decreased muscle tightness, increased joint range of motion, and decreased pain.  Risk discussed included, but were not limited pain and discomfort, bleeding, bruising, excessive weakness, venous thrombosis, muscle atrophy and dysphagia.  A copy of the patient medication guide was given to the patient which explains the blackbox warning.  Patients identity and treatment sites confirmed Yes.  .  Details of Procedure: Skin was cleaned with alcohol.  A 30 gauge, 1/2 inch needle was introduced to the target muscle.  Prior to injection, the needle plunger was aspirated to make sure the needle was not within a blood vessel.  There was no blood retrieved on aspiration.    Following is a summary of the muscles injected  And the amount of Botulinum toxin used:  Injections  Location Left  Right Units Number of sites        Corrugator      Frontalis      Lower Lid, Lateral 2.5  2.5 1  Lower Lid Medial 2.5  2.5 1  Upper Lid, Lateral 2.5  2.5 1  Upper Lid, Medial      Canthus 5.0  5.0 1  Temporalis      Nasalis 2.5  2.5 1  Procerus      Zygomaticus Major      TOTAL UNITS:   15    Agent: Botulinum Type A ( Onobotulinum Toxin type A ).  1 vials of Botox were used, each containing 50 units and freshly diluted with 2 mL of sterile, non-perserved saline   Total injected (Units):15 Pt tolerated procedure well without complications.   Reinjection is anticipated in 3 months.  Botulinum Clinic   History:  Diagnosis: chronic migraine  Initial side: bilateral     Consent obtained from: The patient Benefits discussed included, but were not limited to decreased muscle tightness, increased joint range of motion, and decreased pain.  Risk discussed included, but were not limited pain and discomfort, bleeding, bruising, excessive weakness, venous thrombosis, muscle atrophy and dysphagia.  A copy of the patient medication guide was given to the patient which explains the blackbox warning.  Patients identity and treatment sites confirmed Yes.  .  Following is a summary of the muscles injected  And the amount of Botulinum toxin used:  Injections  Location Left  Right Units Number of sites        Corrugator 5.0 5.0 10 1 each side  Frontalis 2.5/2.5 2.5/2.'5 10 2 '$ each  procerus   5 1  temporalis 5.0 x 4 5.0 x 4 40 4 each  occipitalis 5.0x 2 5.0x '2 20 2 '$ each                                      TOTAL UNITS:   85     Agent: Botulinum Type A ( Onobotulinum Toxin type A ).  100 unit vial of Botox used, each containing 50 units and freshly diluted with 2 mL of  sterile, non-perserved saline   Total injected (Units):  85  Total wasted (Units): none wasted (used above for hemifacial spasm) Pt tolerated procedure well without complications.   Reinjection is anticipated in 3 months.

## 2021-09-20 NOTE — Telephone Encounter (Signed)
Please make sure that for next botox we get authorization for the diagnosis of blepharospasm (will likely be okay) but need to make sure (currently left hemifacial spasm)

## 2021-09-23 NOTE — Telephone Encounter (Signed)
PA was canceled due to Active PA on file- Effective from 03/19/2021 through 02/17/2022  Lyndel Safe, [place] Patient [place] Patient Advocate Team Direct Number: [phone]  Fax: [phone]

## 2021-10-11 ENCOUNTER — Other Ambulatory Visit (HOSPITAL_BASED_OUTPATIENT_CLINIC_OR_DEPARTMENT_OTHER): Payer: Self-pay | Admitting: Obstetrics & Gynecology

## 2021-10-11 DIAGNOSIS — Z1231 Encounter for screening mammogram for malignant neoplasm of breast: Secondary | ICD-10-CM

## 2021-10-14 ENCOUNTER — Ambulatory Visit: Payer: BC Managed Care – PPO | Attending: Cardiology

## 2021-10-14 DIAGNOSIS — I700 Atherosclerosis of aorta: Secondary | ICD-10-CM

## 2021-10-14 DIAGNOSIS — E7800 Pure hypercholesterolemia, unspecified: Secondary | ICD-10-CM

## 2021-10-14 LAB — LIPID PANEL
Chol/HDL Ratio: 1.6 ratio (ref 0.0–4.4)
Cholesterol, Total: 112 mg/dL (ref 100–199)
HDL: 69 mg/dL (ref >39)
LDL Chol Calc (NIH): 30 mg/dL (ref 0–99)
Triglycerides: 57 mg/dL (ref 0–149)
VLDL Cholesterol Cal: 13 mg/dL (ref 5–40)

## 2021-10-14 LAB — ALT: ALT: 21 IU/L (ref 0–32)

## 2021-11-15 ENCOUNTER — Ambulatory Visit (HOSPITAL_BASED_OUTPATIENT_CLINIC_OR_DEPARTMENT_OTHER): Payer: BC Managed Care – PPO | Admitting: Radiology

## 2021-11-15 ENCOUNTER — Ambulatory Visit (HOSPITAL_BASED_OUTPATIENT_CLINIC_OR_DEPARTMENT_OTHER)
Admission: RE | Admit: 2021-11-15 | Discharge: 2021-11-15 | Disposition: A | Discharge status: Home / Self Care | Payer: BC Managed Care – PPO | Source: Ambulatory Visit | Attending: Obstetrics & Gynecology | Admitting: Obstetrics & Gynecology

## 2021-11-15 DIAGNOSIS — Z1231 Encounter for screening mammogram for malignant neoplasm of breast: Secondary | ICD-10-CM | POA: Insufficient documentation

## 2021-11-18 ENCOUNTER — Ambulatory Visit (HOSPITAL_BASED_OUTPATIENT_CLINIC_OR_DEPARTMENT_OTHER): Payer: BC Managed Care – PPO | Admitting: Radiology

## 2021-12-27 ENCOUNTER — Ambulatory Visit: Payer: BC Managed Care – PPO | Admitting: Neurology

## 2022-01-06 ENCOUNTER — Telehealth: Payer: Self-pay | Admitting: Cardiology

## 2022-01-06 ENCOUNTER — Encounter: Payer: Self-pay | Admitting: Pharmacist

## 2022-01-06 NOTE — Telephone Encounter (Signed)
Pt had her cholesterol checked in September and showed excellent response with LDL down to 30 on Repatha and rosuvastatin. Since she responded well to [place] and her cholesterol is at goal, usually just check lipids annually. She doesn't need her cholesterol checked since it just was a few months ago unless she really wants to recheck it again.

## 2022-01-06 NOTE — Telephone Encounter (Signed)
Will route this to our PharmD team to further review and advise on, being they have been managing her lipids and repatha.

## 2022-01-06 NOTE — Telephone Encounter (Signed)
Patient stated she is on Repatha and will need orders to have blood work done.

## 2022-01-06 NOTE — Telephone Encounter (Signed)
Pt also sent me a mychart message which I have responded to and she has acknowledged, pt doesn't need to be called with this message.

## 2022-01-10 ENCOUNTER — Ambulatory Visit: Payer: BC Managed Care – PPO | Admitting: Neurology

## 2022-01-14 ENCOUNTER — Encounter: Payer: Self-pay | Admitting: Pharmacist

## 2022-01-31 ENCOUNTER — Ambulatory Visit: Payer: BC Managed Care – PPO | Admitting: Neurology

## 2022-02-06 ENCOUNTER — Telehealth: Payer: BC Managed Care – PPO | Admitting: Pharmacy Technician

## 2022-02-06 NOTE — Telephone Encounter (Signed)
Botox PA expires 02/17/22

## 2022-02-07 ENCOUNTER — Ambulatory Visit (INDEPENDENT_AMBULATORY_CARE_PROVIDER_SITE_OTHER): Payer: BC Managed Care – PPO | Admitting: Neurology

## 2022-02-07 DIAGNOSIS — G5132 Clonic hemifacial spasm, left: Secondary | ICD-10-CM | POA: Diagnosis not present

## 2022-02-07 DIAGNOSIS — G43709 Chronic migraine without aura, not intractable, without status migrainosus: Secondary | ICD-10-CM | POA: Diagnosis not present

## 2022-02-07 DIAGNOSIS — G245 Blepharospasm: Secondary | ICD-10-CM

## 2022-02-07 MED ORDER — ONABOTULINUMTOXINA 100 UNITS IJ SOLR
100.0000 [IU] | Freq: Once | INTRAMUSCULAR | Status: AC
  Administered 2022-02-07: 100 [IU] via INTRAMUSCULAR

## 2022-02-07 NOTE — Progress Notes (Signed)
Botulinum Clinic   History:  Diagnosis: Hemifacial spasm; blepharospasm  Initial side: left  Result History  Onset of effect: helped but she has not had injections for a long time so is late for dosing and has twitching   Consent obtained from: The patient Benefits discussed included, but were not limited to decreased muscle tightness, increased joint range of motion, and decreased pain.  Risk discussed included, but were not limited pain and discomfort, bleeding, bruising, excessive weakness, venous thrombosis, muscle atrophy and dysphagia.  A copy of the patient medication guide was given to the patient which explains the blackbox warning.  Patients identity and treatment sites confirmed Yes.  .  Details of Procedure: Skin was cleaned with alcohol.  A 30 gauge, 1/2 inch needle was introduced to the target muscle.  Prior to injection, the needle plunger was aspirated to make sure the needle was not within a blood vessel.  There was no blood retrieved on aspiration.    Following is a summary of the muscles injected  And the amount of Botulinum toxin used:  Injections  Location Left  Right Units Number of sites        Corrugator      Frontalis      Lower Lid, Lateral 2.5  2.5 1  Lower Lid Medial 2.5  2.5 1  Upper Lid, Lateral 2.5  2.5 1  Upper Lid, Medial      Canthus 5.0  5.0 1  Temporalis      Nasalis 2.5  2.5 1  Procerus      Zygomaticus Major      TOTAL UNITS:   15    Agent: Botulinum Type A ( Onobotulinum Toxin type A ).  1 vials of Botox were used, each containing 50 units and freshly diluted with 2 mL of sterile, non-perserved saline   Total injected (Units):15 Pt tolerated procedure well without complications.   Reinjection is anticipated in 3 months.  Botulinum Clinic   History:  Diagnosis: chronic migraine  Initial side: bilateral    Consent obtained from: The patient Benefits discussed included, but were not limited to decreased muscle tightness, increased  joint range of motion, and decreased pain.  Risk discussed included, but were not limited pain and discomfort, bleeding, bruising, excessive weakness, venous thrombosis, muscle atrophy and dysphagia.  A copy of the patient medication guide was given to the patient which explains the blackbox warning.  Patients identity and treatment sites confirmed Yes.  .  Following is a summary of the muscles injected  And the amount of Botulinum toxin used:  Injections  Location Left  Right Units Number of sites        Corrugator 5.0 5.0 10 1 each side  Frontalis 2.5/2.5 2.5/2.'5 10 2 '$ each  procerus   5 1  temporalis 5.0 x 4 5.0 x 4 40 4 each  occipitalis 5.0x 2 5.0x '2 20 2 '$ each                                      TOTAL UNITS:   85     Agent: Botulinum Type A ( Onobotulinum Toxin type A ).  100 unit vial of Botox used, each containing 50 units and freshly diluted with 2 mL of sterile, non-perserved saline   Total injected (Units):  85  Total wasted (Units): none wasted (used above for hemifacial spasm) Pt tolerated procedure well  without complications.   Reinjection is anticipated in 3 months.

## 2022-04-03 ENCOUNTER — Other Ambulatory Visit (HOSPITAL_COMMUNITY): Payer: Self-pay

## 2022-04-07 ENCOUNTER — Other Ambulatory Visit (HOSPITAL_COMMUNITY): Payer: Self-pay

## 2022-04-24 NOTE — Telephone Encounter (Signed)
Next Botox due 04/2022  Submitted a Prior Authorization request to [facility] for  Botox  via Fax. Will update once we receive a response.  Phone# [phone] Fax# [phone]

## 2022-05-09 ENCOUNTER — Ambulatory Visit: Payer: BC Managed Care – PPO | Admitting: Neurology

## 2022-05-12 ENCOUNTER — Other Ambulatory Visit (HOSPITAL_COMMUNITY): Payer: Self-pay

## 2022-05-12 NOTE — Telephone Encounter (Signed)
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

## 2022-05-16 ENCOUNTER — Ambulatory Visit (INDEPENDENT_AMBULATORY_CARE_PROVIDER_SITE_OTHER): Payer: BC Managed Care – PPO | Admitting: Neurology

## 2022-05-16 DIAGNOSIS — G5132 Clonic hemifacial spasm, left: Secondary | ICD-10-CM | POA: Diagnosis not present

## 2022-05-16 MED ORDER — ONABOTULINUMTOXINA 100 UNITS IJ SOLR
100.0000 [IU] | Freq: Once | INTRAMUSCULAR | Status: AC
  Administered 2022-05-16: 100 [IU] via INTRAMUSCULAR

## 2022-05-16 NOTE — Procedures (Signed)
[facility]   History:  Diagnosis: Hemifacial spasm; blepharospasm; migraine x > 15 days/month lasting >4 hours each  Initial side: left  Result History  Doing well   Consent obtained from: The patient Benefits discussed included, but were not limited to decreased muscle tightness, increased joint range of motion, and decreased pain.  Risk discussed included, but were not limited pain and discomfort, bleeding, bruising, excessive weakness, venous thrombosis, muscle atrophy and dysphagia.  A copy of the patient medication guide was given to the patient which explains the blackbox warning.  Patients identity and treatment sites confirmed Yes.  .  Details of Procedure: Skin was cleaned with alcohol.  A 30 gauge, 1/2 inch needle was introduced to the target muscle.  Prior to injection, the needle plunger was aspirated to make sure the needle was not within a blood vessel.  There was no blood retrieved on aspiration.    Following is a summary of the muscles injected  And the amount of Botulinum toxin used:  Injections  Location Left  Right Units Number of sites        Corrugator      Frontalis      Lower Lid, Lateral 2.5  2.5 1  Lower Lid Medial 2.5  2.5 1  Upper Lid, Lateral 2.5  2.5 1  Upper Lid, Medial      Canthus 5.0  5.0 1  Temporalis      Nasalis 2.5  2.5 1  Procerus      Zygomaticus Major      TOTAL UNITS:   15    Agent: Botulinum Type A ( Onobotulinum Toxin type A ).  1 vials of Botox were used, each containing 50 units and freshly diluted with 2 mL of sterile, non-perserved saline   Total injected (Units):15 Pt tolerated procedure well without complications.   Reinjection is anticipated in 3 months.  [facility]   History:  Diagnosis: chronic migraine  Initial side: bilateral    Consent obtained from: The patient Benefits discussed included, but were not limited to decreased muscle tightness, increased joint range of motion, and decreased pain.  Risk  discussed included, but were not limited pain and discomfort, bleeding, bruising, excessive weakness, venous thrombosis, muscle atrophy and dysphagia.  A copy of the patient medication guide was given to the patient which explains the blackbox warning.  Patients identity and treatment sites confirmed Yes.  .  Following is a summary of the muscles injected  And the amount of Botulinum toxin used:  Injections  Location Left  Right Units Number of sites        Corrugator 5.0 5.0 10 1 each side  Frontalis 2.5/2.5 2.5/2.5 10 2  each  procerus   5 1  temporalis 5.0 x 4 5.0 x 4 40 4 each  occipitalis 5.0x 2 5.0x each                                      TOTAL UNITS:   85     Agent: Botulinum Type A ( Onobotulinum Toxin type A ).  100 unit vial of Botox used, each containing 50 units and freshly diluted with 2 mL of sterile, non-perserved saline   Total injected (Units):  85  Total wasted (Units): none wasted (used above for hemifacial spasm) Pt tolerated procedure well without complications.   Reinjection is anticipated in 3 months.

## 2022-05-19 ENCOUNTER — Encounter: Payer: Self-pay | Admitting: Neurology

## 2022-05-20 ENCOUNTER — Other Ambulatory Visit (HOSPITAL_COMMUNITY): Payer: Self-pay

## 2022-05-20 ENCOUNTER — Telehealth: Payer: Self-pay | Admitting: Pharmacy Technician

## 2022-05-20 NOTE — Telephone Encounter (Signed)
BotoxOne Benefit Verification [id number] Submitted  To Make sure there is a PA on file for [employer].  Patient received a call form BC[employer] that her Prior Authorization had been denied. There are PA approved letters in Media.

## 2022-05-21 NOTE — Telephone Encounter (Signed)
Hi Chelsea, it appears patient had a Botox auth on file for Jan 2023, and it looks like she was filling through [facility] back then also. Marlowe Kays had notes setting up the shipment for this office visit.  Did she state the bill came from [facility] or from the pharmacy? The 02/15/21 OV does not appear the office billed for the Botox J-code, could this be an error on the billing side. [facility] should have billed the drug.   Hope this helps!

## 2022-05-22 ENCOUNTER — Other Ambulatory Visit: Payer: Self-pay

## 2022-05-22 ENCOUNTER — Other Ambulatory Visit (HOSPITAL_COMMUNITY): Payer: Self-pay

## 2022-05-22 DIAGNOSIS — G43709 Chronic migraine without aura, not intractable, without status migrainosus: Secondary | ICD-10-CM

## 2022-05-22 DIAGNOSIS — G5132 Clonic hemifacial spasm, left: Secondary | ICD-10-CM

## 2022-05-22 DIAGNOSIS — G245 Blepharospasm: Secondary | ICD-10-CM

## 2022-05-22 MED ORDER — BOTOX 100 UNITS IJ SOLR: INTRAMUSCULAR | 1 refills | Status: DC

## 2022-05-26 NOTE — Telephone Encounter (Signed)
F/u  [facility] calling Received fax prior authorization.  [facility] is not listed on prior authorization further plan Please call the insurance plan an have them to add [facility].   Any questions please Phone # [phone]

## 2022-06-05 ENCOUNTER — Other Ambulatory Visit (HOSPITAL_COMMUNITY): Payer: Self-pay

## 2022-06-06 NOTE — Telephone Encounter (Signed)
PA has been approved. [facility] has been updated on the the prior authorization. PA has been scanned to media  Patient Advocate Encounter  Prior Authorization for BOTOX has been approved with BCBS.    PA# [id number] Effective dates: 5.9.24 through 4.10.25

## 2022-06-17 ENCOUNTER — Telehealth: Payer: Self-pay | Admitting: Neurology

## 2022-06-17 NOTE — Telephone Encounter (Signed)
Called patient back and she is super frustrated with billing patient doesn't have medicare and is to young to have it so she does not know why BCBS is denying it when that is the only insurance that she has ever had

## 2022-06-17 NOTE — Telephone Encounter (Signed)
Pt called in wanting to speak with Leeroy Bock about the insurance issue the pt is having

## 2022-07-29 ENCOUNTER — Encounter: Payer: Self-pay | Admitting: Neurology

## 2022-08-04 ENCOUNTER — Other Ambulatory Visit (HOSPITAL_COMMUNITY): Payer: Self-pay

## 2022-08-04 ENCOUNTER — Telehealth: Payer: Self-pay | Admitting: Pharmacy Technician

## 2022-08-04 NOTE — Telephone Encounter (Signed)
Pharmacy Patient Advocate Encounter- Botox BIV-Pharmacy Benefit:  PA was submitted to [employer] and has been approved through: 7.8.24 - 7.8.25 Authorization# [id number]  Please send prescription to Specialty Pharmacy: [facility]: [phone]  Estimated Copay is: $0  Patient IS eligible for Botox Copay Card, which will make patient's copay as little as zero. Copay card will be provided to pharmacy.    BotoxOne verification has been submitted. Benefit Verification:   [id number]  Pharmacy PA has been submitted for BOTOX 200U via CMM. INSURANCE: CIG[employer] DATE SUBMITTED: 7.8.24 KEY: [id number] Status is pending

## 2022-08-05 ENCOUNTER — Other Ambulatory Visit: Payer: Self-pay

## 2022-08-05 ENCOUNTER — Other Ambulatory Visit (HOSPITAL_COMMUNITY): Payer: Self-pay

## 2022-08-05 DIAGNOSIS — G5132 Clonic hemifacial spasm, left: Secondary | ICD-10-CM

## 2022-08-05 MED ORDER — BOTOX 100 UNITS IJ SOLR
INTRAMUSCULAR | 1 refills | Status: DC
  Filled 2022-08-05: qty 1, fill #0
  Filled 2022-08-06: qty 1, 84d supply, fill #0
  Filled 2022-11-04: qty 1, 84d supply, fill #1

## 2022-08-05 NOTE — Telephone Encounter (Signed)
PA has been updated for 100u and is being processed successfully at [facility].

## 2022-08-06 ENCOUNTER — Other Ambulatory Visit: Payer: Self-pay

## 2022-08-13 ENCOUNTER — Other Ambulatory Visit: Payer: Self-pay

## 2022-08-14 ENCOUNTER — Telehealth: Payer: Self-pay

## 2022-08-14 ENCOUNTER — Other Ambulatory Visit (HOSPITAL_COMMUNITY): Payer: Self-pay

## 2022-08-14 NOTE — Telephone Encounter (Signed)
Pharmacy Patient Advocate Encounter   Received notification from CoverMyMeds that prior authorization for REPATHA is required/requested.   Insurance verification completed.   The patient is insured through [facility] .   Per test claim: PA submitted to [employer] via CoverMyMeds Key/confirmation #/EOC [id number]               Status is pending

## 2022-08-15 ENCOUNTER — Other Ambulatory Visit (HOSPITAL_COMMUNITY): Payer: Self-pay

## 2022-08-15 ENCOUNTER — Ambulatory Visit: Payer: BC Managed Care – PPO | Admitting: Neurology

## 2022-08-20 ENCOUNTER — Other Ambulatory Visit (HOSPITAL_BASED_OUTPATIENT_CLINIC_OR_DEPARTMENT_OTHER): Payer: Self-pay | Admitting: Family Medicine

## 2022-08-20 DIAGNOSIS — Z1231 Encounter for screening mammogram for malignant neoplasm of breast: Secondary | ICD-10-CM

## 2022-08-20 NOTE — Telephone Encounter (Signed)
Pharmacy Patient Advocate Encounter  Received notification from [facility] that Prior Authorization for REPATHA has been APPROVED from 08/14/22 to 08/14/23.Marland Kitchen

## 2022-08-22 ENCOUNTER — Ambulatory Visit (INDEPENDENT_AMBULATORY_CARE_PROVIDER_SITE_OTHER): Payer: Managed Care, Other (non HMO) | Admitting: Neurology

## 2022-08-22 DIAGNOSIS — G245 Blepharospasm: Secondary | ICD-10-CM | POA: Diagnosis not present

## 2022-08-22 DIAGNOSIS — G43709 Chronic migraine without aura, not intractable, without status migrainosus: Secondary | ICD-10-CM

## 2022-08-22 DIAGNOSIS — G5132 Clonic hemifacial spasm, left: Secondary | ICD-10-CM

## 2022-08-22 MED ORDER — ONABOTULINUMTOXINA 100 UNITS IJ SOLR
100.0000 [IU] | Freq: Once | INTRAMUSCULAR | Status: AC
  Administered 2022-08-22: 100 [IU] via INTRAMUSCULAR

## 2022-08-22 NOTE — Procedures (Signed)
[facility]   History:  Diagnosis: Hemifacial spasm; blepharospasm; migraine x > 15 days/month lasting >4 hours each  Initial side: left  Result History  Doing well   Consent obtained from: The patient Benefits discussed included, but were not limited to decreased muscle tightness, increased joint range of motion, and decreased pain.  Risk discussed included, but were not limited pain and discomfort, bleeding, bruising, excessive weakness, venous thrombosis, muscle atrophy and dysphagia.  A copy of the patient medication guide was given to the patient which explains the blackbox warning.  Patients identity and treatment sites confirmed Yes.  .  Details of Procedure: Skin was cleaned with alcohol.  A 30 gauge, 1/2 inch needle was introduced to the target muscle.  Prior to injection, the needle plunger was aspirated to make sure the needle was not within a blood vessel.  There was no blood retrieved on aspiration.    Following is a summary of the muscles injected  And the amount of Botulinum toxin used:  Injections  Location Left  Right Units Number of sites        Corrugator      Frontalis      Lower Lid, Lateral 2.5  2.5 1  Lower Lid Medial 2.5  2.5 1  Upper Lid, Lateral 2.5  2.5 1  Upper Lid, Medial      Canthus 2.5  2.5 1  Temporalis      Nasalis      Procerus      Zygomaticus Major      TOTAL UNITS:   10    Agent: Botulinum Type A ( Onobotulinum Toxin type A ).  1 vials of Botox were used, each containing 50 units and freshly diluted with 2 mL of sterile, non-perserved saline   Total injected (Units):10 Pt tolerated procedure well without complications.   Reinjection is anticipated in 3 months.  [facility]   History:  Diagnosis: chronic migraine  Initial side: bilateral    Consent obtained from: The patient Benefits discussed included, but were not limited to decreased muscle tightness, increased joint range of motion, and decreased pain.  Risk  discussed included, but were not limited pain and discomfort, bleeding, bruising, excessive weakness, venous thrombosis, muscle atrophy and dysphagia.  A copy of the patient medication guide was given to the patient which explains the blackbox warning.  Patients identity and treatment sites confirmed Yes.  .  Following is a summary of the muscles injected  And the amount of Botulinum toxin used:  Injections  Location Left  Right Units Number of sites        Corrugator 5.0 5.0 10 1 each side  Frontalis 2.5/2.5 2.5/2.5 10 2  each  procerus   5 1  temporalis 5.0 x 4 5.0 x 4 40 4 each  occipitalis 5.0x 3 5.0x 2 25 2  each                                      TOTAL UNITS:   90     Agent: Botulinum Type A ( Onobotulinum Toxin type A ).  100 unit vial of Botox used, each containing 50 units and freshly diluted with 2 mL of sterile, non-perserved saline   Total injected (Units): 90  Total wasted (Units): none wasted (used above for hemifacial spasm) Pt tolerated procedure well without complications.   Reinjection is anticipated in 3 months.

## 2022-08-25 ENCOUNTER — Other Ambulatory Visit: Payer: Self-pay

## 2022-08-27 ENCOUNTER — Ambulatory Visit (HOSPITAL_BASED_OUTPATIENT_CLINIC_OR_DEPARTMENT_OTHER): Payer: BC Managed Care – PPO | Admitting: Obstetrics & Gynecology

## 2022-09-26 ENCOUNTER — Other Ambulatory Visit (HOSPITAL_COMMUNITY): Payer: Self-pay

## 2022-11-04 ENCOUNTER — Other Ambulatory Visit: Payer: Self-pay

## 2022-11-04 ENCOUNTER — Other Ambulatory Visit (HOSPITAL_COMMUNITY): Payer: Self-pay

## 2022-11-04 NOTE — Progress Notes (Signed)
Specialty Pharmacy Refill Coordination Note  Margaret Webb is a 62 y.o. female contacted today regarding refills of specialty medication(s) Onabotulinumtoxina   Patient requested Delivery   Delivery date: 11/24/22   Verified address: [facility] [place], [address]   Medication will be filled on 11/21/22.

## 2022-11-20 ENCOUNTER — Ambulatory Visit (HOSPITAL_BASED_OUTPATIENT_CLINIC_OR_DEPARTMENT_OTHER)
Admission: RE | Admit: 2022-11-20 | Discharge: 2022-11-20 | Disposition: A | Discharge status: Home / Self Care | Payer: Managed Care, Other (non HMO) | Source: Ambulatory Visit | Attending: Family Medicine | Admitting: Family Medicine

## 2022-11-20 DIAGNOSIS — Z1231 Encounter for screening mammogram for malignant neoplasm of breast: Secondary | ICD-10-CM | POA: Diagnosis present

## 2022-11-21 ENCOUNTER — Other Ambulatory Visit: Payer: Self-pay

## 2022-11-21 ENCOUNTER — Ambulatory Visit: Payer: Managed Care, Other (non HMO) | Admitting: Neurology

## 2022-11-24 ENCOUNTER — Other Ambulatory Visit (HOSPITAL_COMMUNITY)
Admission: RE | Admit: 2022-11-24 | Discharge: 2022-11-24 | Disposition: A | Discharge status: Home / Self Care | Payer: Managed Care, Other (non HMO) | Source: Ambulatory Visit | Attending: Obstetrics & Gynecology | Admitting: Obstetrics & Gynecology

## 2022-11-24 ENCOUNTER — Ambulatory Visit (HOSPITAL_BASED_OUTPATIENT_CLINIC_OR_DEPARTMENT_OTHER): Payer: Managed Care, Other (non HMO) | Admitting: Radiology

## 2022-11-24 ENCOUNTER — Ambulatory Visit (INDEPENDENT_AMBULATORY_CARE_PROVIDER_SITE_OTHER): Payer: Managed Care, Other (non HMO) | Admitting: Obstetrics & Gynecology

## 2022-11-24 ENCOUNTER — Encounter (HOSPITAL_BASED_OUTPATIENT_CLINIC_OR_DEPARTMENT_OTHER): Payer: Self-pay | Admitting: Obstetrics & Gynecology

## 2022-11-24 VITALS — BP 134/99 | HR 65 | Ht 64.0 in | Wt 198.6 lb

## 2022-11-24 DIAGNOSIS — Z803 Family history of malignant neoplasm of breast: Secondary | ICD-10-CM | POA: Diagnosis not present

## 2022-11-24 DIAGNOSIS — F5101 Primary insomnia: Secondary | ICD-10-CM

## 2022-11-24 DIAGNOSIS — Z124 Encounter for screening for malignant neoplasm of cervix: Secondary | ICD-10-CM

## 2022-11-24 DIAGNOSIS — Z780 Asymptomatic menopausal state: Secondary | ICD-10-CM

## 2022-11-24 DIAGNOSIS — Z01419 Encounter for gynecological examination (general) (routine) without abnormal findings: Secondary | ICD-10-CM | POA: Diagnosis not present

## 2022-11-24 NOTE — Progress Notes (Signed)
62 y.o. G2P1 Married White or Caucasian female here for annual exam.  Denies vaginal bleeding.  Living in [place] now.  Moved there due to grandchildren.  Moving all of her care to the [facility] area.  Still working in [place].    Reports having some issues with ears.  Saw ENT.  Recommended MRI.  She ended up calling [employer] and customer service provider was very helpful.    Patient's last menstrual period was 01/27/2006.          Sexually active: Yes.    The current method of family planning is post menopausal status.    Smoker:  no  Health Maintenance: Pap:  08/04/2019 Negative History of abnormal Pap:  no MMG:  11/20/2022 Colonoscopy:  10/31/2016, follow up 10 years BMD:   11/14/2020 Osteopenia Screening Labs: 01/2022 with Kathee Delton   reports that she has never smoked. She has never used smokeless tobacco. She reports current alcohol use of about 1.0 standard drink of alcohol per week. She reports that she does not use drugs.  Past Medical History:  Diagnosis Date   Agatston coronary artery calcium score between 200 and 399    calcium score of 287 which is 96 percentile for age and gender matched controls   Aortic atherosclerosis (HCC)    Breast calcification, right 07/27/2012   Dental bridge present    lower   Dental crowns present    Headache(784.0)    sinus   HTN (hypertension)    Hypercholesteremia    Hyperlipidemia    Infertility    h/o with second pregnancy   Seasonal allergies     Past Surgical History:  Procedure Laterality Date   BREAST BIOPSY Right 08/09/2012   Procedure: BREAST BIOPSY WITH NEEDLE LOCALIZATION;  Surgeon: Currie Paris, MD;  Location: [facility];  Service: General;  Laterality: Right;  needle localization at breast center of [place] 10    CESAREAN SECTION     CORRECTION HAMMER TOE Left 11/2017   with bunionectomy   FOOT SURGERY Right 06/08/2018   HYSTEROSCOPY W/ ENDOMETRIAL ABLATION  11/09/2006   IR 3D INDEPENDENT  [facility]  12/03/2018   IR ANGIO INTRA EXTRACRAN SEL COM CAROTID INNOMINATE BILAT MOD SED  12/03/2018   IR ANGIO VERTEBRAL SEL SUBCLAVIAN INNOMINATE UNI L MOD SED  12/03/2018   IR ANGIO VERTEBRAL SEL VERTEBRAL UNI R MOD SED  12/03/2018   IR US GUIDE VASC ACCESS RIGHT  12/03/2018    Current Outpatient Medications  Medication Sig Dispense Refill   ALPRAZolam (XANAX) 0.5 MG tablet Take 0.25-0.5 mg by mouth daily as needed.     botulinum toxin Type A (BOTOX) 100 units SOLR injection Inject 100 units into head and neck into head and neck every 90 days 1 each 1   calcipotriene-betamethasone (TACLONEX) ointment Apply topically daily.     Evolocumab (REPATHA SURECLICK) 140 MG/ML SOAJ Inject 1 Pen into the skin every 14 (fourteen) days. 6 mL 3   rosuvastatin (CRESTOR) 40 MG tablet Take 1 tablet (40 mg total) by mouth daily.     zolpidem (AMBIEN) 10 MG tablet Take 10 mg by mouth at bedtime as needed for sleep.      No current facility-administered medications for this visit.    Family History  Problem Relation Age of Onset   Hypertension Mother    Heart attack Mother 80   Hypercholesterolemia Father    Cancer Sister        breast, bone   Breast cancer  Sister    Heart attack Brother 32   Healthy Brother    Heart attack Maternal Grandmother    Heart attack Paternal Grandmother    Diabetes Paternal Grandfather    Cancer Paternal Grandfather        unknown type   Healthy Daughter    Breast cancer Cousin     ROS: Constitutional: negative Genitourinary:negative  Exam:   BP (!) 134/99 (BP Location: Right Arm, Patient Position: Sitting, Cuff Size: Large)   Pulse 65   Ht 5\' 4"  (1.626 m)   Wt 198 lb 9.6 oz (90.1 kg)   LMP 01/27/2006   BMI 34.09 kg/m   Height: 5\' 4"  (162.6 cm)  General appearance: alert, cooperative and appears stated age Head: Normocephalic, without obvious abnormality, atraumatic Neck: no adenopathy, supple, symmetrical, trachea midline and thyroid normal to inspection and  palpation Lungs: clear to auscultation bilaterally Breasts: normal appearance, no masses or tenderness Heart: regular rate and rhythm Abdomen: soft, non-tender; bowel sounds normal; no masses,  no organomegaly Extremities: extremities normal, atraumatic, no cyanosis or edema Skin: Skin color, texture, turgor normal. No rashes or lesions Lymph nodes: Cervical, supraclavicular, and axillary nodes normal. No abnormal inguinal nodes palpated Neurologic: Grossly normal   Pelvic: External genitalia:  no lesions              Urethra:  normal appearing urethra with no masses, tenderness or lesions              Bartholins and Skenes: normal                 Vagina: normal appearing vagina with normal color and no discharge, no lesions              Cervix: no lesions              Pap taken: Yes.   Bimanual Exam:  Uterus:  normal size, contour, position, consistency, mobility, non-tender              Adnexa: normal adnexa and no mass, fullness, tenderness               Rectovaginal: Confirms               Anus:  normal sphincter tone, no lesions  Chaperone, Ina Homes, CMA, was present for exam.  Assessment/Plan: 1. Well woman exam with routine gynecological exam - Pap smear and HR HPV obtained today - Mammogram done last week.  Has not been read yet. - Colonoscopy 2018.  Follow up 10 years - Bone mineral density 2022 - lab work done with PCP, Kathee Delton - vaccines reviewed/updated  2. Cervical cancer screening - Cytology - PAP( )  3. Primary insomnia  4. Postmenopausal - not on HRT  5. Family history of breast cancer - Tyrer Cusick model done with 14% lifetime risk.  Marland Kitchen

## 2022-11-26 LAB — CYTOLOGY - PAP
Comment: NEGATIVE
Diagnosis: NEGATIVE
High risk HPV: NEGATIVE

## 2022-11-28 ENCOUNTER — Ambulatory Visit: Payer: Managed Care, Other (non HMO) | Admitting: Neurology

## 2022-11-28 DIAGNOSIS — G43709 Chronic migraine without aura, not intractable, without status migrainosus: Secondary | ICD-10-CM | POA: Diagnosis not present

## 2022-11-28 DIAGNOSIS — G5132 Clonic hemifacial spasm, left: Secondary | ICD-10-CM

## 2022-11-28 MED ORDER — ONABOTULINUMTOXINA 100 UNITS IJ SOLR
100.0000 [IU] | Freq: Once | INTRAMUSCULAR | Status: AC
  Administered 2022-11-28: 100 [IU] via INTRAMUSCULAR

## 2022-11-28 NOTE — Procedures (Signed)
[facility]   History:  Diagnosis: Hemifacial spasm; blepharospasm; migraine x > 15 days/month lasting >4 hours each  Initial side: left  Result History  Doing well   Consent obtained from: The patient Benefits discussed included, but were not limited to decreased muscle tightness, increased joint range of motion, and decreased pain.  Risk discussed included, but were not limited pain and discomfort, bleeding, bruising, excessive weakness, venous thrombosis, muscle atrophy and dysphagia.  A copy of the patient medication guide was given to the patient which explains the blackbox warning.  Patients identity and treatment sites confirmed Yes.  .  Details of Procedure: Skin was cleaned with alcohol.  A 30 gauge, 1/2 inch needle was introduced to the target muscle.  Prior to injection, the needle plunger was aspirated to make sure the needle was not within a blood vessel.  There was no blood retrieved on aspiration.    Following is a summary of the muscles injected  And the amount of Botulinum toxin used:  Injections  Location Left  Right Units Number of sites        Corrugator      Frontalis      Lower Lid, Lateral 2.5  2.5 1  Lower Lid Medial 2.5  2.5 1  Upper Lid, Lateral 2.5  2.5 1  Upper Lid, Medial      Canthus 2.5  2.5 1  Temporalis      Nasalis      Procerus      Zygomaticus Major      TOTAL UNITS:   10    Agent: Botulinum Type A ( Onobotulinum Toxin type A ).  1 vials of Botox were used, each containing 50 units and freshly diluted with 2 mL of sterile, non-perserved saline   Total injected (Units):10 Pt tolerated procedure well without complications.   Reinjection is anticipated in 3 months.  [facility]   History:  Diagnosis: chronic migraine  Initial side: bilateral    Consent obtained from: The patient Benefits discussed included, but were not limited to decreased muscle tightness, increased joint range of motion, and decreased pain.  Risk  discussed included, but were not limited pain and discomfort, bleeding, bruising, excessive weakness, venous thrombosis, muscle atrophy and dysphagia.  A copy of the patient medication guide was given to the patient which explains the blackbox warning.  Patients identity and treatment sites confirmed Yes.  .  Following is a summary of the muscles injected  And the amount of Botulinum toxin used:  Injections  Location Left  Right Units Number of sites        Corrugator 5.0 5.0 10 1 each side  Frontalis 2.5/2.5 2.5/2.5 10 2  each  procerus   5 1  temporalis 5.0 x 4 5.0 x 4 40 4 each  occipitalis 5.0x 3 5.0x 2 25 2  each                                      TOTAL UNITS:   90     Agent: Botulinum Type A ( Onobotulinum Toxin type A ).  100 unit vial of Botox used, each containing 50 units and freshly diluted with 2 mL of sterile, non-perserved saline   Total injected (Units): 90  Total wasted (Units): none wasted (used above for hemifacial spasm) Pt tolerated procedure well without complications.   Reinjection is anticipated in 3 months.

## 2022-12-03 ENCOUNTER — Other Ambulatory Visit: Payer: Self-pay

## 2022-12-05 ENCOUNTER — Encounter: Payer: Self-pay | Admitting: Neurology

## 2022-12-22 NOTE — Progress Notes (Unsigned)
NEUROLOGY FOLLOW UP OFFICE NOTE  Margaret Webb [id number]  Assessment/Plan:   1.  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  I would not prescribe another prednisone taper as she was just recently on one. 2.  Left-sided hemifacial spasm.   3.  Cervical spondylosis   PLAN:  Migraine prevention:  ***  Migraine rescue:  *** Limit use of pain relievers to no more than 2 days out of week to prevent risk of rebound or medication-overuse headache. Keep headache diary ***  Subjective:  Margaret Webb is a 62 year old female with left-sided hemifacial spasm who follows up for migraines.   UPDATE: Last seen in September 2021.   Nortriptyline was switched to Ajovy.  ***   Current NSAIDS:  ibuprofen Current analgesics: none Current triptans:  none Current ergotamine:  none Current anti-emetic:  none Current muscle relaxants: none Current anti-anxiolytic:  none Current sleep aide:  none Current Antihypertensive medications:  none Current Antidepressant medications:  none Current Anticonvulsant medications:  none Current anti-CGRP:  none Current Vitamins/Herbal/Supplements:  none Current Antihistamines/Decongestants:  Flonase Other therapy:  none Hormone/birth control:  none Other medications:  Botox (hemifacial spasm)    HISTORY:  She has had headaches since her 33s.  She describes a severe stabbing pain in the right occipital region.  In July 2020, they started to occur every night.  Prior to then, they would occur 1 to 2 times a week.  She wakes up in the middle of the night (around 12 AM) with the headache and immediately takes acetaminophen-caffeine.  Headache gradually resolves by 6 or 7 AM.  No associated symptoms such as nausea, vomiting, photophobia, phonophobia, visual disturbance or unilateral numbness or weakness.  No particular trigger.  The acetaminophen-caffeine  pills no longer seem as effective.  She has a constant dull bifrontal headache as well.   She has degenerative disease of the cervical spine.  She has had multiple imaging.  Last MRI of cervical spine from 11/26/16 was personally reviewed and demonstrated degeneration at C4-C5 and C5-C6 causing mild spinal stenosis and moderate foraminal stenosis, as well as mild right foraminal disc bulge at C3-C4.  She was previously treated by Dr. Newell Coral and underwent injections.  She denies radicular pain, numbness or weakness in the upper extremities.   For 10 years, she has had near-constant twitching of the left eye.  For the same amount of time, she also has noted facial asymmetry, specifically mild left lower facial droop.  She says sometimes she drools from the left side of her mouth.   MRI of brain without contrast from 11/05/12 was personally reviewed and demonstrated some premature cerebral and cerebellar atrophy but no acute intracranial abnormalities.   She was referred to Dr. Arbutus Leas for hemifacial spasm.  She has been receiving Botox.  MRI brain w wo and MRA of head performed on 10/17 and 11/18/2018 respectively showed tortuous left vertebral artery in close proximityto left CN VII entry zone noted, as well as 1.6 mm outpouching from left cavernous ICA consistent with infundibulum or meningeal branch aneurysm.  She was seen by Dr. Corliss Skains.  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  Findings will be monitored.     Past NSAIDS:  Diclofenac 75mg  tablet, Mobic (back and neck pain) Past analgesics:  acetaminophen-caffeine Past abortive triptans:  none Past abortive ergotamine:  none Past muscle relaxants:  Baclofen, Flexeril, tizanidine Past anti-emetic:  none Past antihypertensive medications: none Past antidepressant medications:  nortriptyline, sertraline 50mg  Past anticonvulsant  medications:  Topiramate, gabapentin (back pain) Past anti-CGRP:  Ajovy *** Past vitamins/Herbal/Supplements:  none Past antihistamines/decongestants:  none Other past therapies:  Trigger point injections (possible occipital nerve blocks) which were ineffective.  PAST MEDICAL HISTORY: Past Medical History:  Diagnosis Date   Agatston coronary artery calcium score between 200 and 399    calcium score of 287 which is 96 percentile for age and gender matched controls   Aortic atherosclerosis (HCC)    Breast calcification, right 07/27/2012   Dental bridge present    lower   Dental crowns present    Headache(784.0)    sinus   HTN (hypertension)    Hypercholesteremia    Hyperlipidemia    Infertility    h/o with second pregnancy   Seasonal allergies     MEDICATIONS: Current Outpatient Medications on File Prior to Visit  Medication Sig Dispense Refill   ALPRAZolam (XANAX) 0.5 MG tablet Take 0.25-0.5 mg by mouth daily as needed.     botulinum toxin Type A (BOTOX) 100 units SOLR injection Inject 100 units into head and neck into head and neck every 90 days 1 each 1   calcipotriene-betamethasone (TACLONEX) ointment Apply topically daily.     Evolocumab (REPATHA SURECLICK) 140 MG/ML SOAJ Inject 1 Pen into the skin every 14 (fourteen) days. 6 mL 3   rosuvastatin (CRESTOR) 40 MG tablet Take 1 tablet (40 mg total) by mouth daily.     zolpidem (AMBIEN) 10 MG tablet Take 10 mg by mouth at bedtime as needed for sleep.      No current facility-administered medications on file prior to visit.    ALLERGIES: Allergies  Allergen Reactions   Iodine     swelling   Oxycodone Nausea Only    Pt stated, "it made my stomach upset" and cognitive change    FAMILY HISTORY: Family History  Problem Relation Age of Onset   Hypertension Mother    Heart attack Mother 83   Hypercholesterolemia Father    Cancer Sister        breast, bone   Breast cancer Sister    Heart attack Brother 56   Healthy  Brother    Heart attack Maternal Grandmother    Heart attack Paternal Grandmother    Diabetes Paternal Grandfather    Cancer Paternal Grandfather        unknown type   Healthy Daughter    Breast cancer Cousin       Objective:  *** General: No acute distress.  Patient appears ***-groomed.   Head:  Normocephalic/atraumatic Eyes:  Fundi examined but not visualized Neck: supple, no paraspinal tenderness, full range of motion Heart:  Regular rate and rhythm Lungs:  Clear to auscultation bilaterally Back: No paraspinal tenderness Neurological Exam: alert and oriented.  Speech fluent and not dysarthric, language intact.  CN II-XII intact. Bulk and tone normal, muscle strength 5/5 throughout.  Sensation to light touch intact.  Deep tendon reflexes 2+ throughout, toes downgoing.  Finger to nose testing intact.  Gait normal, Romberg negative.   Shon Millet, DO  CC: ***

## 2022-12-23 ENCOUNTER — Encounter: Payer: Self-pay | Admitting: Neurology

## 2022-12-23 ENCOUNTER — Ambulatory Visit: Payer: Managed Care, Other (non HMO) | Admitting: Neurology

## 2022-12-23 VITALS — BP 148/91 | HR 66 | Ht 64.0 in | Wt 193.0 lb

## 2022-12-23 DIAGNOSIS — I671 Cerebral aneurysm, nonruptured: Secondary | ICD-10-CM | POA: Diagnosis not present

## 2022-12-23 DIAGNOSIS — M5481 Occipital neuralgia: Secondary | ICD-10-CM

## 2022-12-23 DIAGNOSIS — G43009 Migraine without aura, not intractable, without status migrainosus: Secondary | ICD-10-CM

## 2022-12-23 DIAGNOSIS — G4481 Hypnic headache: Secondary | ICD-10-CM

## 2022-12-23 MED ORDER — EMGALITY 120 MG/ML ~~LOC~~ SOAJ
240.0000 mg | Freq: Once | SUBCUTANEOUS | 0 refills | Status: DC
Start: 2022-12-23 — End: 2023-02-02

## 2022-12-23 NOTE — Patient Instructions (Signed)
Start Emgality - 2 injections for first dose.  Then 1 injection every 28 days thereafter.  Contact me when you pick up the first dose (2 pens) and then I will send in standing order Take Nurtec when you wake up with headache (1 in 24 hours).  Let me know how it works Schedule for occipital nerve block CTA of head

## 2022-12-24 ENCOUNTER — Telehealth: Payer: Self-pay

## 2022-12-24 NOTE — Telephone Encounter (Signed)
Per pharmacy PA needed for Emgality 120 mg

## 2022-12-29 ENCOUNTER — Encounter: Payer: Self-pay | Admitting: Neurology

## 2022-12-31 ENCOUNTER — Telehealth: Payer: Self-pay | Admitting: Pharmacy Technician

## 2022-12-31 ENCOUNTER — Other Ambulatory Visit (HOSPITAL_COMMUNITY): Payer: Self-pay

## 2022-12-31 NOTE — Telephone Encounter (Signed)
Pharmacy Patient Advocate Encounter   Received notification from CoverMyMeds that prior authorization for [facility] 120MG  is required/requested.   Insurance verification completed.   The patient is insured through [employer] .   Per test claim: PA required; PA submitted to above mentioned insurance via CoverMyMeds Key/confirmation #/EOC [id number] Status is pending

## 2022-12-31 NOTE — Telephone Encounter (Signed)
Pharmacy Patient Advocate Encounter  Received notification from [employer] that Prior Authorization for [facility] 120MG  has been APPROVED from 12.4.24 to 12.4.25. Ran test claim, Copay is $30. This test claim was processed through [facility]- copay amounts may vary at other pharmacies due to pharmacy/plan contracts, or as the patient moves through the different stages of their insurance plan.   PA #/Case ID/Reference #: [id number]

## 2023-01-05 ENCOUNTER — Encounter: Payer: Self-pay | Admitting: Neurology

## 2023-01-05 ENCOUNTER — Other Ambulatory Visit: Payer: Self-pay | Admitting: Neurology

## 2023-01-06 ENCOUNTER — Other Ambulatory Visit: Payer: Self-pay | Admitting: Neurology

## 2023-01-06 ENCOUNTER — Other Ambulatory Visit: Payer: Self-pay

## 2023-01-06 MED ORDER — NURTEC 75 MG PO TBDP: 1.0000 | ORAL_TABLET | Freq: Every day | ORAL | 11 refills | Status: DC | PRN

## 2023-01-06 NOTE — Telephone Encounter (Signed)
Per patient mychart message,  Thank you I do have the card. I have activated the card. The prescription needs to be moved to the [facility] in [place], [place]. I do not use [facility] any longer. Thank you.  Medication sent to the [facility] as requested.

## 2023-01-09 ENCOUNTER — Ambulatory Visit: Payer: Managed Care, Other (non HMO) | Admitting: Neurology

## 2023-01-13 ENCOUNTER — Ambulatory Visit
Admission: RE | Admit: 2023-01-13 | Discharge: 2023-01-13 | Disposition: A | Discharge status: Home / Self Care | Payer: Managed Care, Other (non HMO) | Source: Ambulatory Visit | Attending: Neurology | Admitting: Neurology

## 2023-01-13 DIAGNOSIS — I671 Cerebral aneurysm, nonruptured: Secondary | ICD-10-CM

## 2023-01-13 MED ORDER — IOPAMIDOL (ISOVUE-370) INJECTION 76%
80.0000 mL | Freq: Once | INTRAVENOUS | Status: AC | PRN
  Administered 2023-01-13: 80 mL via INTRAVENOUS

## 2023-01-15 ENCOUNTER — Encounter: Payer: Self-pay | Admitting: Neurology

## 2023-01-16 ENCOUNTER — Ambulatory Visit: Payer: Managed Care, Other (non HMO) | Admitting: Neurology

## 2023-01-16 DIAGNOSIS — M5481 Occipital neuralgia: Secondary | ICD-10-CM | POA: Diagnosis not present

## 2023-01-16 MED ORDER — BUPIVACAINE HCL 0.25 % IJ SOLN
3.0000 mL | Freq: Once | INTRAMUSCULAR | Status: AC
  Administered 2023-01-16: 3 mL via INTRA_ARTICULAR

## 2023-01-16 MED ORDER — TRIAMCINOLONE ACETONIDE 40 MG/ML IJ SUSP
40.0000 mg | Freq: Once | INTRAMUSCULAR | Status: AC
  Administered 2023-01-16: 40 mg via INTRA_ARTICULAR

## 2023-01-16 NOTE — Progress Notes (Signed)
Procedure: Occipital nerve block  Procedure risks, benefits and alternative treatments explained to patient and they agree to proceed.   A concentration of 0.25% bupivacaine (3 ml) was mixed with 40 mg of Kenalog (1 ml). A 27 gauge needle was used for injection. The regions of the right greater and lesser occipital nerves were located by palpation. The areas were prepped with alcohol. A total of 4 cc of the above mixture was injected without difficulty. The patient tolerated the procedure well.    Talaya Lamprecht R. Everlena Cooper, DO

## 2023-01-19 ENCOUNTER — Other Ambulatory Visit (HOSPITAL_COMMUNITY): Payer: Self-pay

## 2023-01-31 ENCOUNTER — Other Ambulatory Visit: Payer: Self-pay | Admitting: Neurology

## 2023-02-03 ENCOUNTER — Encounter: Payer: Self-pay | Admitting: Neurology

## 2023-02-13 ENCOUNTER — Telehealth: Payer: Self-pay

## 2023-02-13 NOTE — Telephone Encounter (Signed)
PA needed for Nurtec. 

## 2023-02-16 ENCOUNTER — Other Ambulatory Visit (HOSPITAL_COMMUNITY): Payer: Self-pay

## 2023-02-16 ENCOUNTER — Telehealth: Payer: Self-pay | Admitting: Pharmacy Technician

## 2023-02-16 NOTE — Telephone Encounter (Signed)
PA has been submitted, and telephone encounter has been created. 

## 2023-02-16 NOTE — Telephone Encounter (Signed)
Pharmacy Patient Advocate Encounter   Received notification from Pt Calls Messages that prior authorization for NURTEC 75MG  is required/requested.   Insurance verification completed.   The patient is insured through [employer] .   Per test claim: PA required; PA submitted to above mentioned insurance via CoverMyMeds Key/confirmation #/EOC [id number] Status is pending

## 2023-02-17 NOTE — Telephone Encounter (Signed)
Rosann Auerbach called in stating they denied the prior authorization for Nurtec

## 2023-02-19 ENCOUNTER — Other Ambulatory Visit: Payer: Self-pay | Admitting: Neurology

## 2023-02-19 ENCOUNTER — Other Ambulatory Visit: Payer: Self-pay

## 2023-02-19 DIAGNOSIS — G5132 Clonic hemifacial spasm, left: Secondary | ICD-10-CM

## 2023-02-19 NOTE — Telephone Encounter (Signed)
Pharmacy Patient Advocate Encounter  Received notification from [employer] that Prior Authorization for Nurtec 75mg  has been DENIED.  Full denial letter will be uploaded to the media tab. See denial reason below.  There is nothing to support that the individual meets one of the following (A or B): A. Patient has tried at least one triptan; or B. Patient has a contraindication to triptan(s) according to the prescriber. Note: Examples of contraindications to triptans include a history of coronary artery disease; cardiac accessory conduction pathway disorders; history of stroke, transient ischemic attack, or hemiplegic or basilar migraine; peripheral vascular disease; ischemic bowel disease; uncontrolled hypertension; or severe hepatic impairment. ? Nurtec ODT is considered medically necessary when one of the following is met: 1. Migraine, Acute Treatment. Approve for 1 year if the patient meets the following (A and B): A. Patient is at least 63 years of age; and B. Patient meets one of the following (i or ii): i. Employer Plans: Patient has tried at least one triptan therapy; ii. Patient has a contraindication to triptan(s) according to the prescriber.  PA #/Case ID/Reference #: [id number]

## 2023-02-19 NOTE — Progress Notes (Signed)
Specialty Pharmacy Refill Coordination Note  Margaret Webb is a 63 y.o. female contacted today regarding refills of specialty medication(s) OnabotulinumtoxinA (Botox)   Patient requested Courier to Provider Office   Delivery date: 03/02/23   Verified address: [facility] [place], [address]   Medication will be filled on 02/27/23.

## 2023-02-20 ENCOUNTER — Other Ambulatory Visit: Payer: Self-pay

## 2023-02-20 MED ORDER — BOTOX 100 UNITS IJ SOLR
INTRAMUSCULAR | 1 refills | Status: DC
  Filled 2023-02-20: qty 1, 90d supply, fill #0
  Filled 2023-05-22 – 2023-09-22 (×7): qty 1, 90d supply, fill #1

## 2023-02-23 ENCOUNTER — Other Ambulatory Visit (HOSPITAL_COMMUNITY): Payer: Self-pay

## 2023-02-24 ENCOUNTER — Other Ambulatory Visit (HOSPITAL_COMMUNITY): Payer: Self-pay

## 2023-02-24 NOTE — Telephone Encounter (Signed)
Pharmacy Patient Advocate Encounter  Received notification from [employer] that Prior Authorization for Nurtec 75MG  dispersible tablets have been APPROVED from 02/24/23 to 02/24/24  However, it was filled 02/16/23 at [facility], next fill 03/11/23   PA #/Case ID/Reference #: [id number]

## 2023-02-24 NOTE — Telephone Encounter (Signed)
Started new PA    Per test claim: PA required; PA submitted to above mentioned insurance via CoverMyMeds Key/confirmation #/EOC [id number] Status is pending  It may come back & say we have to appeal, but I wanted to try this first.

## 2023-02-25 ENCOUNTER — Other Ambulatory Visit (HOSPITAL_COMMUNITY): Payer: Self-pay

## 2023-02-26 ENCOUNTER — Other Ambulatory Visit (HOSPITAL_COMMUNITY): Payer: Self-pay

## 2023-02-27 ENCOUNTER — Other Ambulatory Visit: Payer: Self-pay

## 2023-02-27 ENCOUNTER — Ambulatory Visit: Payer: Managed Care, Other (non HMO) | Admitting: Neurology

## 2023-03-04 ENCOUNTER — Other Ambulatory Visit (HOSPITAL_COMMUNITY): Payer: Self-pay

## 2023-03-04 ENCOUNTER — Telehealth: Payer: Self-pay | Admitting: Pharmacy Technician

## 2023-03-04 NOTE — Telephone Encounter (Signed)
 Pharmacy Patient Advocate Encounter  Pharmacy PA has been submitted for BOTOX  100u via Fax. INSURANCE: [employer] DATE SUBMITTED: 2.5.25 FAX: [phone] Status is pending   COMPLETED FORM WITH CPT [phone] WHICH WAS PROVIDED BY THE OFFICE.  SUBMITTED AS URGENT

## 2023-03-05 ENCOUNTER — Other Ambulatory Visit (HOSPITAL_COMMUNITY): Payer: Self-pay

## 2023-03-06 ENCOUNTER — Other Ambulatory Visit (HOSPITAL_COMMUNITY): Payer: Self-pay

## 2023-03-06 ENCOUNTER — Ambulatory Visit: Payer: Managed Care, Other (non HMO) | Admitting: Neurology

## 2023-03-06 NOTE — Telephone Encounter (Signed)
 Pharmacy Patient Advocate Encounter- Botox  BIV-Medical Benefit:  Pharmacy provided J code: [phone] CPT code: [phone] Dx Code: G51.32  PA was submitted to [employer] and has been approved through: 7.8.24 TO 7.8.25 Pharmacy Authorization# [id number]   Procedure code Authorization # 7.8.24 - 11.1.24 J#[id number] 2.7.25 - 7.8.25 J#[id number]

## 2023-03-07 ENCOUNTER — Other Ambulatory Visit: Payer: Self-pay | Admitting: Neurology

## 2023-03-20 ENCOUNTER — Other Ambulatory Visit: Payer: Self-pay

## 2023-03-20 ENCOUNTER — Encounter: Payer: Self-pay | Admitting: Neurology

## 2023-03-20 ENCOUNTER — Ambulatory Visit: Payer: Managed Care, Other (non HMO) | Admitting: Neurology

## 2023-03-20 DIAGNOSIS — G245 Blepharospasm: Secondary | ICD-10-CM | POA: Diagnosis not present

## 2023-03-20 DIAGNOSIS — G5132 Clonic hemifacial spasm, left: Secondary | ICD-10-CM

## 2023-03-20 MED ORDER — ONABOTULINUMTOXINA 100 UNITS IJ SOLR
100.0000 [IU] | Freq: Once | INTRAMUSCULAR | Status: AC
  Administered 2023-03-20: 100 [IU] via INTRAMUSCULAR

## 2023-03-20 MED ORDER — PREDNISONE 10 MG (21) PO TBPK: ORAL_TABLET | ORAL | 0 refills | Status: DC

## 2023-03-20 NOTE — Telephone Encounter (Signed)
Sent prednisone taper into pharmacy, per Dr. Shon Millet. Feel better:)

## 2023-03-20 NOTE — Procedures (Signed)
[facility]   History:  Diagnosis: Hemifacial spasm; blepharospasm; migraine x > 15 days/month lasting >4 hours each  Initial side: left  Result History  Doing well with spasms; headaches worse   Consent obtained from: The patient Benefits discussed included, but were not limited to decreased muscle tightness, increased joint range of motion, and decreased pain.  Risk discussed included, but were not limited pain and discomfort, bleeding, bruising, excessive weakness, venous thrombosis, muscle atrophy and dysphagia.  A copy of the patient medication guide was given to the patient which explains the blackbox warning.  Patients identity and treatment sites confirmed Yes.  .  Details of Procedure: Skin was cleaned with alcohol.  A 30 gauge, 1/2 inch needle was introduced to the target muscle.  Prior to injection, the needle plunger was aspirated to make sure the needle was not within a blood vessel.  There was no blood retrieved on aspiration.    Following is a summary of the muscles injected  And the amount of Botulinum toxin used:  Injections  Location Left  Right Units Number of sites        Corrugator      Frontalis      Lower Lid, Lateral 2.5  2.5 1  Lower Lid Medial 2.5  2.5 1  Upper Lid, Lateral 2.5  2.5 1  Upper Lid, Medial      Canthus 2.5  2.5 1  Temporalis      Nasalis      Procerus      Zygomaticus Major      TOTAL UNITS:   10    Agent: Botulinum Type A ( Onobotulinum Toxin type A ).  1 vials of Botox were used, each containing 50 units and freshly diluted with 2 mL of sterile, non-perserved saline   Total injected (Units):10 Pt tolerated procedure well without complications.   Reinjection is anticipated in 3 months.  [facility]   History:  Diagnosis: chronic migraine  Initial side: bilateral    Consent obtained from: The patient Benefits discussed included, but were not limited to decreased muscle tightness, increased joint range of motion, and  decreased pain.  Risk discussed included, but were not limited pain and discomfort, bleeding, bruising, excessive weakness, venous thrombosis, muscle atrophy and dysphagia.  A copy of the patient medication guide was given to the patient which explains the blackbox warning.  Patients identity and treatment sites confirmed Yes.  .  Following is a summary of the muscles injected  And the amount of Botulinum toxin used:  Injections  Location Left  Right Units Number of sites        Corrugator 5.0 5.0 10 1 each side  Frontalis 2.5/2.5 2.5/2.5 10 2  each  procerus   5 1  temporalis 5.0 x 4 5.0 x 4 40 4 each  occipitalis 5.0x 3 5.0x 2 25 2  each                                      TOTAL UNITS:   90     Agent: Botulinum Type A ( Onobotulinum Toxin type A ).  100 unit vial of Botox used, each containing 50 units and freshly diluted with 2 mL of sterile, non-perserved saline   Total injected (Units): 90  Total wasted (Units): none wasted (used above for hemifacial spasm) Pt tolerated procedure well without complications.   Reinjection is anticipated in  3 months.

## 2023-04-05 ENCOUNTER — Encounter: Payer: Self-pay | Admitting: Neurology

## 2023-04-16 ENCOUNTER — Ambulatory Visit: Payer: Managed Care, Other (non HMO) | Admitting: Neurology

## 2023-04-23 ENCOUNTER — Encounter: Payer: Self-pay | Admitting: Neurology

## 2023-04-27 ENCOUNTER — Encounter: Payer: Self-pay | Admitting: Neurology

## 2023-05-19 ENCOUNTER — Other Ambulatory Visit (HOSPITAL_COMMUNITY): Payer: Self-pay

## 2023-05-22 ENCOUNTER — Other Ambulatory Visit: Payer: Self-pay | Admitting: Pharmacy Technician

## 2023-05-22 ENCOUNTER — Other Ambulatory Visit: Payer: Self-pay

## 2023-05-22 NOTE — Progress Notes (Signed)
   Patient stopped Botox  04/23/23 due to facial drooping. Plans to resume on 09/18/23. Will call prior to confirm need for refill.

## 2023-05-25 ENCOUNTER — Other Ambulatory Visit: Payer: Self-pay

## 2023-06-19 ENCOUNTER — Ambulatory Visit: Payer: Managed Care, Other (non HMO) | Admitting: Neurology

## 2023-07-08 ENCOUNTER — Other Ambulatory Visit: Payer: Self-pay

## 2023-07-24 ENCOUNTER — Encounter (HOSPITAL_COMMUNITY): Payer: Self-pay | Admitting: Interventional Radiology

## 2023-08-25 ENCOUNTER — Other Ambulatory Visit: Payer: Self-pay

## 2023-09-02 NOTE — Progress Notes (Unsigned)
 NEUROLOGY FOLLOW UP OFFICE NOTE  Margaret Webb [id number]  Assessment/Plan:   1.  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 We can increase to 80mg  daily in 6 weeks if needed  Migraine rescue:  Nurtec.  As it is used only as needed, will not discontinue at this time. Limit use of pain relievers to no more than 9 days out of the month to prevent risk of rebound or medication-overuse headache. Keep headache diary Follow up with Dr. Evonnie for Botox  for hemifacial spasm next month No need to repeat CTA for 5 years Follow up 8 months.  Subjective:  Margaret Webb is a 63 year old female with left-sided hemifacial spasm who follows up for migraines.   UPDATE: She had a right occipital nerve block in December which was helpful.   Started Emgality . Helps but it has been elevating blood pressure.  Started blood pressure medication which has not been helpful.   Lasts 3 or 4 hours with Nurtec, but will use Advil for second line.   Frequency:  less than 3 a month   Current NSAIDS:  ibuprofen Current analgesics: none Current triptans:  none Current ergotamine:  none Current anti-emetic:  none Current muscle relaxants: none Current anti-anxiolytic:  none Current sleep aide:  none Current Antihypertensive medications:  none Current Antidepressant medications:  none Current Anticonvulsant medications:  none Current anti-CGRP:  none Current Vitamins/Herbal/Supplements:  none Current Antihistamines/Decongestants:  Flonase Other therapy:  none Hormone/birth control:  none Other medications:  Botox  (hemifacial spasm)    HISTORY:  She has had headaches since her 28s.  She describes  a severe stabbing pain in the right occipital region and otalgia (either ear).  In July 2020, they started to occur every night.  Prior to then, they would occur 1 to 2 times a week.  Wakes up anywhere from 12 AM to 5 AM.  Takes Excedrin and if can go back asleep, she wakes up feeling fine. No associated symptoms such as nausea, vomiting, photophobia, phonophobia but no visual disturbance or unilateral numbness or weakness.  No particular trigger.  The acetaminophen -caffeine pills no longer seem as effective.  She has a constant dull bifrontal headache as well.   She has degenerative disease of the cervical spine.  She has had multiple imaging.  Last MRI of cervical spine from 11/26/16 was personally reviewed and demonstrated degeneration at C4-C5 and C5-C6 causing mild spinal stenosis and moderate foraminal stenosis, as well as mild right foraminal disc bulge at C3-C4.  She was previously treated by Dr. Nudelman and underwent injections.  She denies radicular pain, numbness or weakness in the upper extremities.   For 10 years, she has had near-constant twitching of the left eye.  For the same amount of time, she also has noted facial asymmetry, specifically mild left lower facial droop.  She says sometimes she drools from the left side of her mouth.   MRI of brain without contrast from 11/05/12 demonstrated some premature cerebral and cerebellar atrophy but no acute intracranial abnormalities.  Repeat MRI of brain with and without contrast on 12/17/2022 was normal except for just trace nonspecific fluid in the left mastoid air cells.     She was referred to Dr. Evonnie for hemifacial  spasm.  She has been receiving Botox .  MRI brain w wo and MRA of head performed on 10/17 and 11/18/2018 respectively showed tortuous left vertebral artery in close proximity to left CN VII entry zone noted, as well as 1.6 mm outpouching from left cavernous ICA consistent with extradural infundibulum or meningeal branch aneurysm.   She was seen by Dr. Dolphus.  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    Past NSAIDS:  Diclofenac  75mg  tablet, Mobic (back and neck pain) Past analgesics:  acetaminophen -caffeine Past abortive triptans:  none Past abortive ergotamine:  none Past muscle relaxants:  Baclofen, Flexeril, tizanidine  Past anti-emetic:  none Past antihypertensive medications: none Past antidepressant medications:  nortriptyline , sertraline  50mg  Past anticonvulsant medications:  Topiramate , gabapentin  (back pain) Past anti-CGRP:  Ajovy  (did not like the type of autoinjector) Past vitamins/Herbal/Supplements:  none Past antihistamines/decongestants:  none Other past therapies:  Trigger point injections (possible occipital nerve blocks) which were ineffective.  PAST MEDICAL HISTORY: Past Medical History:  Diagnosis Date   Agatston coronary artery calcium  score between 200 and 399    calcium  score of 287 which is 96 percentile for age and gender matched controls   Aortic atherosclerosis (HCC)    Breast calcification, right 07/27/2012   Dental bridge present    lower   Dental crowns present    Headache(784.0)    sinus   HTN (hypertension)    Hypercholesteremia    Hyperlipidemia    Infertility    h/o with second pregnancy   Seasonal allergies     MEDICATIONS: Current Outpatient Medications on File Prior to Visit  Medication Sig Dispense Refill   botulinum toxin Type A  (BOTOX ) 100 units SOLR injection Inject 100 units into face every 90 days directed and administered by the MD 1 each 1   ALPRAZolam (XANAX) 0.5 MG tablet Take 0.25-0.5 mg by mouth daily as needed.     botulinum toxin Type A  (BOTOX ) 100 units SOLR injection Inject 100 units into head and neck into head and neck every 90 days 1 each 1   calcipotriene-betamethasone (TACLONEX) ointment Apply topically daily.     Evolocumab   (REPATHA  SURECLICK) 140 MG/ML SOAJ Inject 1 Pen into the skin every 14 (fourteen) days. 6 mL 3   predniSONE  (STERAPRED UNI-PAK 21 TAB) 10 MG (21) TBPK tablet As directed x 6 days 21 tablet 0   Rimegepant Sulfate (NURTEC) 75 MG TBDP Take 1 tablet (75 mg total) by mouth daily as needed. 16 tablet 11   rosuvastatin  (CRESTOR ) 40 MG tablet Take 1 tablet (40 mg total) by mouth daily.     zolpidem (AMBIEN) 10 MG tablet Take 10 mg by mouth at bedtime as needed for sleep.      No current facility-administered medications on file prior to visit.    ALLERGIES: Allergies  Allergen Reactions   Oxycodone  Nausea Only    Pt stated, it made my stomach upset and cognitive change   Shellfish Allergy     Per pt shrimp shell causes rash on hands but able to consume    FAMILY HISTORY: Family History  Problem Relation Age of Onset   Hypertension Mother    Heart attack Mother 16   Hypercholesterolemia Father    Cancer Sister        breast, bone   Breast cancer Sister    Heart attack Brother 77   Healthy Brother    Heart  attack Maternal Grandmother    Heart attack Paternal Grandmother    Diabetes Paternal Grandfather    Cancer Paternal Grandfather        unknown type   Healthy Daughter    Breast cancer Cousin       Objective:  Blood pressure (!) 143/90, pulse 68, resp. rate 20, height 5' 4 (1.626 m), weight 193 lb (87.5 kg), last menstrual period 01/27/2006, SpO2 98%. General: No acute distress.  Patient appears well-groomed.      Juliene Dunnings, DO  CC: Margaret Aho, PA-C

## 2023-09-03 ENCOUNTER — Encounter: Payer: Self-pay | Admitting: Neurology

## 2023-09-03 ENCOUNTER — Ambulatory Visit: Admitting: Neurology

## 2023-09-03 VITALS — BP 143/90 | HR 68 | Resp 20 | Ht 64.0 in | Wt 193.0 lb

## 2023-09-03 DIAGNOSIS — G43009 Migraine without aura, not intractable, without status migrainosus: Secondary | ICD-10-CM

## 2023-09-03 DIAGNOSIS — I671 Cerebral aneurysm, nonruptured: Secondary | ICD-10-CM

## 2023-09-03 DIAGNOSIS — I158 Other secondary hypertension: Secondary | ICD-10-CM | POA: Diagnosis not present

## 2023-09-03 DIAGNOSIS — G5132 Clonic hemifacial spasm, left: Secondary | ICD-10-CM | POA: Diagnosis not present

## 2023-09-03 MED ORDER — PROPRANOLOL HCL ER 60 MG PO CP24: 60.0000 mg | ORAL_CAPSULE | Freq: Every day | ORAL | 5 refills | Status: DC

## 2023-09-03 NOTE — Patient Instructions (Signed)
 Stop Emgality .  Start propranolol  ER 60mg  daily.  If headaches not controlled in 6 weeks, contact me Nurtec once daily as needed

## 2023-09-08 ENCOUNTER — Other Ambulatory Visit: Payer: Self-pay | Admitting: Pharmacy Technician

## 2023-09-08 ENCOUNTER — Other Ambulatory Visit: Payer: Self-pay

## 2023-09-09 ENCOUNTER — Other Ambulatory Visit: Payer: Self-pay

## 2023-09-09 ENCOUNTER — Telehealth: Payer: Self-pay

## 2023-09-09 ENCOUNTER — Encounter: Payer: Self-pay | Admitting: Neurology

## 2023-09-09 ENCOUNTER — Other Ambulatory Visit (HOSPITAL_COMMUNITY): Payer: Self-pay

## 2023-09-09 NOTE — Telephone Encounter (Signed)
 Good Morning talked with [facility] and they are waiting on PA for this patient Botox  64612 Hemi Facial Spasm

## 2023-09-09 NOTE — Telephone Encounter (Signed)
 Good Morning talked with [facility] and they are waiting on PA for this patient Botox  64612 Hemi Facial Spasm

## 2023-09-10 ENCOUNTER — Other Ambulatory Visit: Payer: Self-pay

## 2023-09-10 ENCOUNTER — Telehealth: Payer: Self-pay | Admitting: Pharmacy Technician

## 2023-09-10 ENCOUNTER — Other Ambulatory Visit (HOSPITAL_COMMUNITY): Payer: Self-pay

## 2023-09-10 NOTE — Telephone Encounter (Signed)
 Pharmacy Patient Advocate Encounter   Received notification from Patient Pharmacy that prior authorization for BOTOX  100 is required/requested.   Insurance verification completed.   The patient is insured through [employer] .   Per test claim: PA required; PA submitted to above mentioned insurance via Fax Key/confirmation #/EOC [id number] Status is pending  Dx: G51.31 CPT Code: 35383

## 2023-09-11 ENCOUNTER — Telehealth: Payer: Self-pay | Admitting: Neurology

## 2023-09-11 NOTE — Telephone Encounter (Signed)
 Called and gave info.

## 2023-09-11 NOTE — Telephone Encounter (Signed)
 Caller Margaret Webb phone number [phone] option 4 is calling from [place] commercial review de[employer]. They received a PA request regarding botox , they need a DX, code and how many units they are requesting. CASE # [id number]

## 2023-09-11 NOTE — Telephone Encounter (Signed)
 [employer] cld about Botox  rqst [phone]

## 2023-09-14 ENCOUNTER — Other Ambulatory Visit: Payer: Self-pay

## 2023-09-14 NOTE — Telephone Encounter (Signed)
 Just received call from [employer] stating they did not receive the entire form for Botox  it cut off and they are asking if you could resend Ref # [id number] Fax# [phone] Dx code G24.5 She said to please state not duplicate faxing in original form. I spoke to them Friday and was on hold for an hour I am not sure what else they need

## 2023-09-16 ENCOUNTER — Other Ambulatory Visit (HOSPITAL_COMMUNITY): Payer: Self-pay

## 2023-09-17 ENCOUNTER — Other Ambulatory Visit: Payer: Self-pay

## 2023-09-17 ENCOUNTER — Other Ambulatory Visit (HOSPITAL_COMMUNITY): Payer: Self-pay

## 2023-09-18 ENCOUNTER — Ambulatory Visit: Admitting: Neurology

## 2023-09-18 NOTE — Telephone Encounter (Signed)
 Pharmacy Patient Advocate Encounter- Injection via Pharmacy Benefit:  PA was submitted  for Botox - [id number] to [employer] and has been approved through: 8.15.25 to 8.14.26 Authorization# [id number]  Please send prescription to Specialty Pharmacy: [facility]: [phone]  Estimated Pharmacy Copay is: $125

## 2023-09-21 ENCOUNTER — Other Ambulatory Visit (HOSPITAL_COMMUNITY): Payer: Self-pay

## 2023-09-21 NOTE — Telephone Encounter (Signed)
 Duplicate encounter- See 09/10/23 encounter

## 2023-09-21 NOTE — Progress Notes (Signed)
 Appt moved to 10/02/23

## 2023-09-21 NOTE — Telephone Encounter (Signed)
 Submitted for updated Botox  One report- [id number]

## 2023-09-22 ENCOUNTER — Other Ambulatory Visit: Payer: Self-pay

## 2023-09-22 NOTE — Progress Notes (Signed)
 Specialty Pharmacy Refill Coordination Note  Margaret Webb is a 63 y.o. female assessed today regarding refills of clinic administered specialty medication(s) OnabotulinumtoxinA  (Botox )   Clinic requested Courier to Provider Office   Delivery date: 09/30/23   Verified address: [facility] [address] [place], [address] [place], [place] [address]   Medication will be filled on 09/29/23. Appt on 9/5. $0.00 clean claim.  Sent patient mychart message clear bag.

## 2023-09-23 ENCOUNTER — Ambulatory Visit: Admitting: Neurology

## 2023-09-25 ENCOUNTER — Ambulatory Visit: Admitting: Neurology

## 2023-09-29 ENCOUNTER — Other Ambulatory Visit: Payer: Self-pay

## 2023-09-29 NOTE — Telephone Encounter (Signed)
 I see the updated Botox  one report states the patient does need a PA for admin code 35383. Is she ready to order med and folow through appointment on Friday?

## 2023-10-01 NOTE — Telephone Encounter (Signed)
 CPT Code 35383 does not require a pre certification. Reference # [id number]

## 2023-10-02 ENCOUNTER — Ambulatory Visit: Admitting: Neurology

## 2023-10-02 DIAGNOSIS — G245 Blepharospasm: Secondary | ICD-10-CM | POA: Diagnosis not present

## 2023-10-02 DIAGNOSIS — G43E09 Chronic migraine with aura, not intractable, without status migrainosus: Secondary | ICD-10-CM

## 2023-10-02 MED ORDER — ONABOTULINUMTOXINA 100 UNITS IJ SOLR
100.0000 [IU] | Freq: Once | INTRAMUSCULAR | Status: AC
  Administered 2023-10-02: 100 [IU] via INTRAMUSCULAR

## 2023-10-02 NOTE — Addendum Note (Signed)
 Addended by: MERCER CREE C on: 10/02/2023 09:16 AM   Modules accepted: Orders

## 2023-10-02 NOTE — Addendum Note (Signed)
 Addended by: MERCER CREE C on: 10/02/2023 09:19 AM   Modules accepted: Orders

## 2023-10-02 NOTE — Procedures (Signed)
 [facility]   History:  Diagnosis: Hemifacial spasm; blepharospasm; migraine x > 15 days/month lasting >4 hours each  Initial side: left  Result History/clinical comment Patient has not had any Botox  since April 23, 2023.  After last Botox , she had reported some left facial droop.  This was unusual, given the pattern of injections.  I reviewed her prior chart and noted that we have documented left facial droop, even before starting Botox  in 2020.  Nonetheless, we asked her to hold Botox  for a while, which she did and has not had Botox  now for 5 months.  She continues to have the left facial droop, and she does think it is worsening actually and thinks it is from the chronic hemifacial spasm, because now she is having more spasm.  She does request continuing Botox .  Today, I did remove the injection from the lower medial orbicularis oculi.   Consent obtained from: The patient Benefits discussed included, but were not limited to decreased muscle tightness, increased joint range of motion, and decreased pain.  Risk discussed included, but were not limited pain and discomfort, bleeding, bruising, excessive weakness, venous thrombosis, muscle atrophy and dysphagia.  A copy of the patient medication guide was given to the patient which explains the blackbox warning.  Patients identity and treatment sites confirmed Yes.  .  Details of Procedure: Skin was cleaned with alcohol.  A 30 gauge, 1/2 inch needle was introduced to the target muscle.  Prior to injection, the needle plunger was aspirated to make sure the needle was not within a blood vessel.  There was no blood retrieved on aspiration.    Following is a summary of the muscles injected  And the amount of Botulinum toxin used:  Injections  Location Left  Right Units Number of sites        Corrugator      Frontalis      Lower Lid, Lateral 2.5  2.5 1  Lower Lid Medial      Upper Lid, Lateral 2.5  2.5 1  Upper Lid, Medial      Canthus 2.5   2.5 1  Temporalis      Nasalis      Procerus      Zygomaticus Major      TOTAL UNITS:   7.5    Agent: Botulinum Type A ( Onobotulinum Toxin type A ).  1 vials of Botox  were used, each containing 50 units and freshly diluted with 2 mL of sterile, non-perserved saline   Total injected (Units):7.5 Pt tolerated procedure well without complications.   Reinjection is anticipated in 3 months.  [facility]   History:  Diagnosis: chronic migraine  Initial side: bilateral    Consent obtained from: The patient Benefits discussed included, but were not limited to decreased muscle tightness, increased joint range of motion, and decreased pain.  Risk discussed included, but were not limited pain and discomfort, bleeding, bruising, excessive weakness, venous thrombosis, muscle atrophy and dysphagia.  A copy of the patient medication guide was given to the patient which explains the blackbox warning.  Patients identity and treatment sites confirmed Yes.  .  Following is a summary of the muscles injected  And the amount of Botulinum toxin used:  Injections  Location Left  Right Units Number of sites        Corrugator 5.0 5.0 10 1 each side  Frontalis 2.5/2.5 2.5/2.5 10 2  each  procerus   5 1  temporalis 5.0 x 4 5.0  x 4 40 4 each  occipitalis 5.0x 3 5.0x 2; 2.5 27.5 3 each                                      TOTAL UNITS:   92.5     Agent: Botulinum Type A ( Onobotulinum Toxin type A ).  100 unit vial of Botox  used, each containing 50 units and freshly diluted with 2 mL of sterile, non-perserved saline   Total injected (Units): 90  Total wasted (Units): none wasted (used above for hemifacial spasm) Pt tolerated procedure well without complications.   Reinjection is anticipated in 3 months.

## 2023-10-22 NOTE — Telephone Encounter (Signed)
 Sent patient Mrn to billing contact to review EOB statement.

## 2023-10-23 NOTE — Telephone Encounter (Signed)
 Called placed to [employer] for CPT Code clarification.  CPT Code 35387 is approved from 8.14.25 to 8.26.26 Auth# [id number]

## 2023-11-04 ENCOUNTER — Other Ambulatory Visit (HOSPITAL_BASED_OUTPATIENT_CLINIC_OR_DEPARTMENT_OTHER): Payer: Self-pay | Admitting: Obstetrics & Gynecology

## 2023-11-04 DIAGNOSIS — Z1231 Encounter for screening mammogram for malignant neoplasm of breast: Secondary | ICD-10-CM

## 2023-11-27 ENCOUNTER — Ambulatory Visit (HOSPITAL_BASED_OUTPATIENT_CLINIC_OR_DEPARTMENT_OTHER): Payer: Managed Care, Other (non HMO) | Admitting: Obstetrics & Gynecology

## 2023-11-30 ENCOUNTER — Ambulatory Visit (HOSPITAL_BASED_OUTPATIENT_CLINIC_OR_DEPARTMENT_OTHER)

## 2023-12-01 ENCOUNTER — Ambulatory Visit (HOSPITAL_BASED_OUTPATIENT_CLINIC_OR_DEPARTMENT_OTHER): Admitting: Obstetrics & Gynecology

## 2023-12-04 ENCOUNTER — Ambulatory Visit
Admission: RE | Admit: 2023-12-04 | Discharge: 2023-12-04 | Disposition: A | Discharge status: Home / Self Care | Source: Ambulatory Visit | Attending: Obstetrics & Gynecology | Admitting: Obstetrics & Gynecology

## 2023-12-04 DIAGNOSIS — Z1231 Encounter for screening mammogram for malignant neoplasm of breast: Secondary | ICD-10-CM

## 2023-12-18 ENCOUNTER — Ambulatory Visit: Admitting: Neurology

## 2023-12-19 ENCOUNTER — Other Ambulatory Visit (HOSPITAL_BASED_OUTPATIENT_CLINIC_OR_DEPARTMENT_OTHER): Payer: Self-pay

## 2023-12-21 ENCOUNTER — Other Ambulatory Visit: Payer: Self-pay | Admitting: Neurology

## 2023-12-21 ENCOUNTER — Other Ambulatory Visit: Payer: Self-pay

## 2023-12-21 DIAGNOSIS — G5132 Clonic hemifacial spasm, left: Secondary | ICD-10-CM

## 2023-12-21 MED ORDER — BOTOX 100 UNITS IJ SOLR
INTRAMUSCULAR | 1 refills | Status: AC
  Filled 2023-12-21: qty 1, fill #0
  Filled 2023-12-22: qty 1, 90d supply, fill #0

## 2023-12-22 ENCOUNTER — Other Ambulatory Visit: Payer: Self-pay

## 2023-12-22 NOTE — Progress Notes (Signed)
 Specialty Pharmacy Refill Coordination Note  Margaret Webb is a 63 y.o. female assessed today regarding refills of clinic administered specialty medication(s) OnabotulinumtoxinA  (Botox )   Clinic requested Courier to Provider Office   Delivery date: 12/28/23   Verified address: [facility] [address] [place], [address] [place], [place] [address]   Medication will be filled on: 12/25/23   Copay: $0.00 Appointment: 12.12.25

## 2024-01-08 ENCOUNTER — Ambulatory Visit: Admitting: Neurology

## 2024-01-08 DIAGNOSIS — G5132 Clonic hemifacial spasm, left: Secondary | ICD-10-CM

## 2024-01-08 MED ORDER — ONABOTULINUMTOXINA 100 UNITS IJ SOLR
100.0000 [IU] | Freq: Once | INTRAMUSCULAR | Status: AC
  Administered 2024-01-08: 100 [IU] via INTRAMUSCULAR

## 2024-01-08 NOTE — Procedures (Signed)
 [facility]   History:  Diagnosis: Hemifacial spasm; blepharospasm; migraine x > 15 days/month lasting >4 hours each  Initial side: left  Result History/clinical comment helping   Consent obtained from: The patient Benefits discussed included, but were not limited to decreased muscle tightness, increased joint range of motion, and decreased pain.  Risk discussed included, but were not limited pain and discomfort, bleeding, bruising, excessive weakness, venous thrombosis, muscle atrophy and dysphagia.  A copy of the patient medication guide was given to the patient which explains the blackbox warning.  Patients identity and treatment sites confirmed Yes.  .  Details of Procedure: Skin was cleaned with alcohol.  A 30 gauge, 1/2 inch needle was introduced to the target muscle.  Prior to injection, the needle plunger was aspirated to make sure the needle was not within a blood vessel.  There was no blood retrieved on aspiration.    Following is a summary of the muscles injected  And the amount of Botulinum toxin used:  Injections  Location Left  Right Units Number of sites        Corrugator      Frontalis      Lower Lid, Lateral 2.5  2.5 1  Lower Lid Medial      Upper Lid, Lateral 2.5  2.5 1  Upper Lid, Medial      Canthus 2.5  2.5 1  Temporalis      Nasalis      Procerus      Zygomaticus Major      TOTAL UNITS:   7.5    Agent: Botulinum Type A ( Onobotulinum Toxin type A ).  1 vials of Botox  were used, each containing 50 units and freshly diluted with 2 mL of sterile, non-perserved saline   Total injected (Units):7.5 Pt tolerated procedure well without complications.   Reinjection is anticipated in 3 months.  [facility]   History:  Diagnosis: chronic migraine  Initial side: bilateral    Consent obtained from: The patient Benefits discussed included, but were not limited to decreased muscle tightness, increased joint range of motion, and decreased pain.  Risk  discussed included, but were not limited pain and discomfort, bleeding, bruising, excessive weakness, venous thrombosis, muscle atrophy and dysphagia.  A copy of the patient medication guide was given to the patient which explains the blackbox warning.  Patients identity and treatment sites confirmed Yes.  .  Following is a summary of the muscles injected  And the amount of Botulinum toxin used:  Injections  Location Left  Right Units Number of sites        Corrugator 5.0 5.0 10 1 each side  Frontalis 2.5/2.5 2.5/2.5 10 2  each  procerus   5 1  temporalis 5.0 x 4 5.0 x 4 40 4 each  occipitalis 5.0x 3 5.0x 2; 2.5 27.5 3 each                                       TOTAL UNITS:   92.5     Agent: Botulinum Type A ( Onobotulinum Toxin type A ).  100 unit vial of Botox  used, each containing 50 units and freshly diluted with 2 mL of sterile, non-perserved saline   Total injected (Units): 92.5  Total wasted (Units): none wasted (used above for hemifacial spasm) Pt tolerated procedure well without complications.   Reinjection is anticipated in 3 months.

## 2024-01-27 ENCOUNTER — Encounter: Payer: Self-pay | Admitting: Cardiology

## 2024-01-27 ENCOUNTER — Ambulatory Visit: Admitting: Cardiology

## 2024-01-27 VITALS — BP 110/74 | HR 61 | Ht 64.0 in | Wt 187.0 lb

## 2024-01-27 DIAGNOSIS — I10 Essential (primary) hypertension: Secondary | ICD-10-CM

## 2024-01-27 DIAGNOSIS — Z8249 Family history of ischemic heart disease and other diseases of the circulatory system: Secondary | ICD-10-CM

## 2024-01-27 NOTE — Patient Instructions (Signed)
 Medication Instructions:  None  *If you need a refill on your cardiac medications before your next appointment, please call your pharmacy*  Lab Work: None  If you have labs (blood work) drawn today and your tests are completely normal, you will receive your results only by: MyChart Message (if you have MyChart) OR A paper copy in the mail If you have any lab test that is abnormal or we need to change your treatment, we will call you to review the results.  Testing/Procedures: None   Follow-Up: At [facility], you and your health needs are our priority.  As part of our continuing mission to provide you with exceptional heart care, our providers are all part of one team.  This team includes your primary Cardiologist (physician) and Advanced Practice Providers or APPs (Physician Assistants and Nurse Practitioners) who all work together to provide you with the care you need, when you need it.  Your next appointment:   1 year(s)  Provider:   Wilbert Bihari, MD    We recommend signing up for the patient portal called MyChart.  Sign up information is provided on this After Visit Summary.  MyChart is used to connect with patients for Virtual Visits (Telemedicine).  Patients are able to view lab/test results, encounter notes, upcoming appointments, etc.  Non-urgent messages can be sent to your provider as well.   To learn more about what you can do with MyChart, go to [email or web address].   Other Instructions None

## 2024-01-27 NOTE — Progress Notes (Signed)
 " Cardiology Office Note:  .   Date:  01/27/2024  ID:  Margaret Webb, DOB October 29, 1960, MRN [id number] PCP: Margaret Josette MOHR PA-C  [facility] Providers Cardiologist:  Margaret Bihari, MD {  History of Present Illness: Margaret   Nimsi Webb is a 63 y.o. female with history of hypertension, family history of CAD, hyperlipidemia.     Family history of CAD Mother and brother with history of CAD. 03/2021 CAC scoring 287.  Aortic valve calcium  scoring 130. 04/2021 ETT normal.  Social history  No history of tobacco use.  Occasionally drinks. No drugs. Lately active 1 to 2 days/week. Mother with history of MI, brother with history of MI 39.    Patient with no prior significant cardiac history.  Has family history of CAD so CAC scoring ordered and 287, ETT was normal.  Last seen 03/2021 and doing well from a cardiac perspective and only with mild lower extremity edema.  LDL goal less than 100.    Today patient presents for annual follow-up.  She has no complaints today and really doing well, was recently started on blood pressure medications by her PCP at [facility] which she is not happy about and hoping to wean off of.  She had an episode of tachycardia in [place] when she had lost her phone and her watch had initially demonstrated atrial fibrillation but she does not think so and has not had any reoccurrences or triggers or any other symptoms since being back.  She feels that might have been related to stress and did not want to proceed with heart monitor.  Otherwise she is doing well, active, walking 12,000+ steps in [place] without any exertional limitations.  ROS: Denies: Chest pain, shortness of breath, orthopnea, peripheral edema, palpitations, syncope, decreased exercise capacity, fatigue, dizziness.   Studies Reviewed: Margaret    EKG Interpretation Date/Time:  Wednesday January 27 2024 11:24:40 EST Ventricular Rate:  61 PR Interval:  154 QRS Duration:  76 QT Interval:  402 QTC  Calculation: 404 R Axis:   21  Text Interpretation: Normal sinus rhythm When compared with ECG of 05-May-2000 05:16, No significant change since last tracing Confirmed by Margaret Webb [phone]) on 01/27/2024 11:43:05 AM    Risk Assessment/Calculations:           Physical Exam:   VS:  BP 110/74   Pulse 61   Ht 5' 4 (1.626 m)   Wt 187 lb (84.8 kg)   LMP 01/27/2006   SpO2 98%   BMI 32.10 kg/m    Wt Readings from Last 3 Encounters:  01/27/24 187 lb (84.8 kg)  09/03/23 193 lb (87.5 kg)  12/23/22 193 lb (87.5 kg)    GEN: Well nourished, well developed in no acute distress NECK: No JVD; No carotid bruits CARDIAC: RRR, no murmurs, rubs, gallops RESPIRATORY:  Clear to auscultation without rales, wheezing or rhonchi  ABDOMEN: Soft, non-tender, non-distended EXTREMITIES:  No edema; No deformity   ASSESSMENT AND PLAN: .    Family history of CAD Hyperlipidemia -03/2021 CAC score 287. Doing well overall with no exertional symptoms or anginal equivalents that would prompt further evaluation at this time.   Continue with aspirin 81 mg daily, Repatha , rosuvastatin  40 mg.  PCP checks labs and manages cholesterol.  Hypertension Will update her list blood pressure medications but BP is controlled.  She is hoping to wean off of some of these. She reports she is on amlodipine 2.5 mg and losartan 100 mg daily.  Continue.  Headaches On propranolol    Dispo: 1 year follow-up with Dr. Shlomo.  We have updated her medication list.  Signed, Margaret LITTIE Sluder, PA-C  "

## 2024-01-28 ENCOUNTER — Encounter: Payer: Self-pay | Admitting: Neurology

## 2024-01-28 ENCOUNTER — Other Ambulatory Visit: Payer: Self-pay | Admitting: Neurology

## 2024-02-11 ENCOUNTER — Other Ambulatory Visit: Payer: Self-pay | Admitting: Neurology

## 2024-02-11 ENCOUNTER — Encounter: Payer: Self-pay | Admitting: Neurology

## 2024-02-12 ENCOUNTER — Other Ambulatory Visit (HOSPITAL_COMMUNITY): Payer: Self-pay

## 2024-02-12 ENCOUNTER — Other Ambulatory Visit: Payer: Self-pay

## 2024-02-12 ENCOUNTER — Telehealth: Payer: Self-pay | Admitting: Pharmacist

## 2024-02-12 ENCOUNTER — Ambulatory Visit: Admitting: Emergency Medicine

## 2024-02-12 MED ORDER — PROPRANOLOL HCL ER 60 MG PO CP24: 60.0000 mg | ORAL_CAPSULE | Freq: Every day | ORAL | 5 refills | Status: AC

## 2024-02-12 NOTE — Telephone Encounter (Signed)
 Pharmacy Patient Advocate Encounter   Received notification from CoverMyMeds that prior authorization for Nurtec is due for renewal.   Insurance verification completed.   The patient is insured through [employer].  PA not needed per test claim, medication is covered until 02/24/2024. Insurance will not allow a resubmission until current PA expires.

## 2024-04-15 ENCOUNTER — Ambulatory Visit: Admitting: Neurology

## 2024-05-16 ENCOUNTER — Ambulatory Visit (HOSPITAL_BASED_OUTPATIENT_CLINIC_OR_DEPARTMENT_OTHER): Admitting: Obstetrics & Gynecology

## 2024-05-23 ENCOUNTER — Ambulatory Visit: Admitting: Neurology
